# Patient Record
Sex: Female | Born: 1960
Health system: Southern US, Community
[De-identification: ages and names within clinical notes are randomized; demographics above are authoritative.]

---

## 1998-08-30 ENCOUNTER — Other Ambulatory Visit: Admission: RE | Admit: 1998-08-30 | Discharge: 1998-08-30 | Payer: Self-pay | Admitting: Obstetrics and Gynecology

## 1999-09-29 ENCOUNTER — Other Ambulatory Visit: Admission: RE | Admit: 1999-09-29 | Discharge: 1999-09-29 | Payer: Self-pay | Admitting: *Deleted

## 2000-11-20 ENCOUNTER — Ambulatory Visit (HOSPITAL_COMMUNITY): Admission: RE | Admit: 2000-11-20 | Discharge: 2000-11-20 | Payer: Self-pay | Admitting: Obstetrics and Gynecology

## 2000-11-20 ENCOUNTER — Encounter: Payer: Self-pay | Admitting: Obstetrics and Gynecology

## 2001-05-27 ENCOUNTER — Other Ambulatory Visit: Admission: RE | Admit: 2001-05-27 | Discharge: 2001-05-27 | Payer: Self-pay | Admitting: Obstetrics and Gynecology

## 2002-08-19 ENCOUNTER — Other Ambulatory Visit: Admission: RE | Admit: 2002-08-19 | Discharge: 2002-08-19 | Payer: Self-pay | Admitting: Obstetrics and Gynecology

## 2002-11-14 ENCOUNTER — Inpatient Hospital Stay (HOSPITAL_COMMUNITY): Admission: AD | Admit: 2002-11-14 | Discharge: 2002-11-14 | Payer: Self-pay | Admitting: Obstetrics and Gynecology

## 2002-11-24 ENCOUNTER — Observation Stay (HOSPITAL_COMMUNITY): Admission: RE | Admit: 2002-11-24 | Discharge: 2002-11-25 | Payer: Self-pay | Admitting: Obstetrics and Gynecology

## 2003-02-11 ENCOUNTER — Encounter: Payer: Self-pay | Admitting: Obstetrics and Gynecology

## 2003-02-11 ENCOUNTER — Ambulatory Visit (HOSPITAL_COMMUNITY): Admission: RE | Admit: 2003-02-11 | Discharge: 2003-02-11 | Payer: Self-pay | Admitting: Obstetrics and Gynecology

## 2004-08-23 ENCOUNTER — Other Ambulatory Visit: Admission: RE | Admit: 2004-08-23 | Discharge: 2004-08-23 | Payer: Self-pay | Admitting: Obstetrics and Gynecology

## 2004-09-01 ENCOUNTER — Ambulatory Visit (HOSPITAL_COMMUNITY): Admission: RE | Admit: 2004-09-01 | Discharge: 2004-09-01 | Payer: Self-pay | Admitting: Obstetrics and Gynecology

## 2005-12-27 ENCOUNTER — Other Ambulatory Visit: Admission: RE | Admit: 2005-12-27 | Discharge: 2005-12-27 | Payer: Self-pay | Admitting: Obstetrics and Gynecology

## 2006-01-04 ENCOUNTER — Ambulatory Visit (HOSPITAL_COMMUNITY): Admission: RE | Admit: 2006-01-04 | Discharge: 2006-01-04 | Payer: Self-pay | Admitting: Obstetrics and Gynecology

## 2006-01-11 ENCOUNTER — Encounter: Admission: RE | Admit: 2006-01-11 | Discharge: 2006-01-11 | Payer: Self-pay | Admitting: Orthopedic Surgery

## 2017-12-27 DIAGNOSIS — H11002 Unspecified pterygium of left eye: Secondary | ICD-10-CM | POA: Diagnosis not present

## 2018-06-25 DIAGNOSIS — M542 Cervicalgia: Secondary | ICD-10-CM | POA: Diagnosis not present

## 2018-06-25 DIAGNOSIS — M25511 Pain in right shoulder: Secondary | ICD-10-CM | POA: Diagnosis not present

## 2018-08-27 DIAGNOSIS — Z13220 Encounter for screening for lipoid disorders: Secondary | ICD-10-CM | POA: Diagnosis not present

## 2018-08-27 DIAGNOSIS — Z0000 Encounter for general adult medical examination without abnormal findings: Secondary | ICD-10-CM | POA: Diagnosis not present

## 2021-12-14 LAB — EXTERNAL GENERIC LAB PROCEDURE: COLOGUARD: NEGATIVE

## 2021-12-14 LAB — COLOGUARD: COLOGUARD: NEGATIVE

## 2023-01-02 ENCOUNTER — Other Ambulatory Visit: Payer: Self-pay | Admitting: Obstetrics and Gynecology

## 2023-01-02 DIAGNOSIS — Z1231 Encounter for screening mammogram for malignant neoplasm of breast: Secondary | ICD-10-CM

## 2023-04-13 ENCOUNTER — Other Ambulatory Visit: Payer: Self-pay

## 2023-04-13 ENCOUNTER — Emergency Department (HOSPITAL_COMMUNITY): Payer: 59

## 2023-04-13 ENCOUNTER — Inpatient Hospital Stay (HOSPITAL_COMMUNITY)
Admission: EM | Admit: 2023-04-13 | Discharge: 2023-04-19 | DRG: 392 | Disposition: A | Discharge status: Home / Self Care | Payer: 59 | Source: Ambulatory Visit | Attending: Student | Admitting: Student

## 2023-04-13 ENCOUNTER — Encounter (HOSPITAL_COMMUNITY): Payer: Self-pay | Admitting: Emergency Medicine

## 2023-04-13 DIAGNOSIS — R7401 Elevation of levels of liver transaminase levels: Secondary | ICD-10-CM | POA: Diagnosis present

## 2023-04-13 DIAGNOSIS — E559 Vitamin D deficiency, unspecified: Secondary | ICD-10-CM | POA: Diagnosis present

## 2023-04-13 DIAGNOSIS — K5740 Diverticulitis of both small and large intestine with perforation and abscess without bleeding: Secondary | ICD-10-CM | POA: Diagnosis not present

## 2023-04-13 DIAGNOSIS — E876 Hypokalemia: Secondary | ICD-10-CM | POA: Diagnosis not present

## 2023-04-13 DIAGNOSIS — R1030 Lower abdominal pain, unspecified: Principal | ICD-10-CM

## 2023-04-13 DIAGNOSIS — Z79890 Hormone replacement therapy (postmenopausal): Secondary | ICD-10-CM

## 2023-04-13 DIAGNOSIS — Z90710 Acquired absence of both cervix and uterus: Secondary | ICD-10-CM

## 2023-04-13 DIAGNOSIS — D509 Iron deficiency anemia, unspecified: Secondary | ICD-10-CM | POA: Diagnosis present

## 2023-04-13 DIAGNOSIS — E871 Hypo-osmolality and hyponatremia: Secondary | ICD-10-CM | POA: Diagnosis present

## 2023-04-13 DIAGNOSIS — K5792 Diverticulitis of intestine, part unspecified, without perforation or abscess without bleeding: Secondary | ICD-10-CM | POA: Insufficient documentation

## 2023-04-13 DIAGNOSIS — E860 Dehydration: Secondary | ICD-10-CM | POA: Diagnosis present

## 2023-04-13 DIAGNOSIS — K5720 Diverticulitis of large intestine with perforation and abscess without bleeding: Principal | ICD-10-CM | POA: Diagnosis present

## 2023-04-13 DIAGNOSIS — E785 Hyperlipidemia, unspecified: Secondary | ICD-10-CM | POA: Insufficient documentation

## 2023-04-13 DIAGNOSIS — R109 Unspecified abdominal pain: Secondary | ICD-10-CM | POA: Diagnosis present

## 2023-04-13 LAB — LIPASE, BLOOD: Lipase: 33 U/L (ref 11–51)

## 2023-04-13 LAB — CBC
HCT: 35.6 % — ABNORMAL LOW (ref 36.0–46.0)
Hemoglobin: 11.8 g/dL — ABNORMAL LOW (ref 12.0–15.0)
MCH: 25 pg — ABNORMAL LOW (ref 26.0–34.0)
MCHC: 33.1 g/dL (ref 30.0–36.0)
MCV: 75.4 fL — ABNORMAL LOW (ref 80.0–100.0)
Platelets: 334 10*3/uL (ref 150–400)
RBC: 4.72 MIL/uL (ref 3.87–5.11)
RDW: 13.8 % (ref 11.5–15.5)
WBC: 10.2 10*3/uL (ref 4.0–10.5)
nRBC: 0 % (ref 0.0–0.2)

## 2023-04-13 LAB — URINALYSIS, ROUTINE W REFLEX MICROSCOPIC
Bilirubin Urine: NEGATIVE
Glucose, UA: NEGATIVE mg/dL
Ketones, ur: NEGATIVE mg/dL
Leukocytes,Ua: NEGATIVE
Nitrite: NEGATIVE
Protein, ur: NEGATIVE mg/dL
Specific Gravity, Urine: 1.003 — ABNORMAL LOW (ref 1.005–1.030)
pH: 8 (ref 5.0–8.0)

## 2023-04-13 LAB — COMPREHENSIVE METABOLIC PANEL
ALT: 161 U/L — ABNORMAL HIGH (ref 0–44)
AST: 56 U/L — ABNORMAL HIGH (ref 15–41)
Albumin: 3.3 g/dL — ABNORMAL LOW (ref 3.5–5.0)
Alkaline Phosphatase: 333 U/L — ABNORMAL HIGH (ref 38–126)
Anion gap: 9 (ref 5–15)
BUN: 6 mg/dL — ABNORMAL LOW (ref 8–23)
CO2: 25 mmol/L (ref 22–32)
Calcium: 8.7 mg/dL — ABNORMAL LOW (ref 8.9–10.3)
Chloride: 97 mmol/L — ABNORMAL LOW (ref 98–111)
Creatinine, Ser: 0.67 mg/dL (ref 0.44–1.00)
GFR, Estimated: >60 mL/min (ref >60)
Glucose, Bld: 118 mg/dL — ABNORMAL HIGH (ref 70–99)
Potassium: 3.5 mmol/L (ref 3.5–5.1)
Sodium: 131 mmol/L — ABNORMAL LOW (ref 135–145)
Total Bilirubin: 0.6 mg/dL (ref 0.3–1.2)
Total Protein: 7.2 g/dL (ref 6.5–8.1)

## 2023-04-13 LAB — HEPATITIS PANEL, ACUTE
HCV Ab: NONREACTIVE
Hep A IgM: NONREACTIVE
Hep B C IgM: NONREACTIVE
Hepatitis B Surface Ag: NONREACTIVE

## 2023-04-13 LAB — HIV ANTIBODY (ROUTINE TESTING W REFLEX): HIV Screen 4th Generation wRfx: NONREACTIVE

## 2023-04-13 LAB — ACETAMINOPHEN LEVEL: Acetaminophen (Tylenol), Serum: <10 ug/mL — ABNORMAL LOW (ref 10–30)

## 2023-04-13 MED ORDER — SODIUM CHLORIDE 0.9 % IV BOLUS (SEPSIS)
1000.0000 mL | Freq: Once | INTRAVENOUS | Status: AC
  Administered 2023-04-13: 1000 mL via INTRAVENOUS

## 2023-04-13 MED ORDER — ACETAMINOPHEN 325 MG PO TABS
650.0000 mg | ORAL_TABLET | Freq: Four times a day (QID) | ORAL | Status: DC | PRN
  Administered 2023-04-15: 650 mg via ORAL
  Filled 2023-04-13: qty 2

## 2023-04-13 MED ORDER — METRONIDAZOLE 500 MG/100ML IV SOLN
500.0000 mg | Freq: Once | INTRAVENOUS | Status: AC
  Administered 2023-04-13: 500 mg via INTRAVENOUS
  Filled 2023-04-13: qty 100

## 2023-04-13 MED ORDER — IOHEXOL 350 MG/ML SOLN
55.0000 mL | Freq: Once | INTRAVENOUS | Status: AC | PRN
  Administered 2023-04-13: 55 mL via INTRAVENOUS

## 2023-04-13 MED ORDER — ENOXAPARIN SODIUM 40 MG/0.4ML IJ SOSY: 40.0000 mg | PREFILLED_SYRINGE | INTRAMUSCULAR | Status: DC

## 2023-04-13 MED ORDER — HYDROMORPHONE HCL 1 MG/ML IJ SOLN
0.5000 mg | INTRAMUSCULAR | Status: DC | PRN
  Administered 2023-04-13: 0.5 mg via INTRAVENOUS
  Administered 2023-04-14 – 2023-04-15 (×2): 1 mg via INTRAVENOUS
  Filled 2023-04-13 (×3): qty 1

## 2023-04-13 MED ORDER — ONDANSETRON HCL 4 MG PO TABS: 4.0000 mg | ORAL_TABLET | Freq: Four times a day (QID) | ORAL | Status: DC | PRN

## 2023-04-13 MED ORDER — MORPHINE SULFATE (PF) 4 MG/ML IV SOLN
4.0000 mg | Freq: Once | INTRAVENOUS | Status: AC
  Administered 2023-04-13: 4 mg via INTRAVENOUS
  Filled 2023-04-13: qty 1

## 2023-04-13 MED ORDER — SODIUM CHLORIDE 0.9 % IV SOLN: 2.0000 g | INTRAVENOUS | Status: DC

## 2023-04-13 MED ORDER — SODIUM CHLORIDE 0.9 % IV SOLN: INTRAVENOUS | Status: DC

## 2023-04-13 MED ORDER — SODIUM CHLORIDE 0.9 % IV SOLN
1.0000 g | Freq: Once | INTRAVENOUS | Status: AC
  Administered 2023-04-13: 1 g via INTRAVENOUS
  Filled 2023-04-13: qty 10

## 2023-04-13 MED ORDER — ACETAMINOPHEN 650 MG RE SUPP: 650.0000 mg | Freq: Four times a day (QID) | RECTAL | Status: DC | PRN

## 2023-04-13 MED ORDER — RISAQUAD PO CAPS
2.0000 | ORAL_CAPSULE | Freq: Three times a day (TID) | ORAL | Status: DC
  Administered 2023-04-13 – 2023-04-19 (×16): 2 via ORAL
  Filled 2023-04-13 (×18): qty 2

## 2023-04-13 MED ORDER — ONDANSETRON HCL 4 MG/2ML IJ SOLN
4.0000 mg | Freq: Once | INTRAMUSCULAR | Status: AC
  Administered 2023-04-13: 4 mg via INTRAVENOUS
  Filled 2023-04-13: qty 2

## 2023-04-13 MED ORDER — ONDANSETRON HCL 4 MG/2ML IJ SOLN
4.0000 mg | Freq: Four times a day (QID) | INTRAMUSCULAR | Status: DC | PRN
  Administered 2023-04-14 – 2023-04-18 (×6): 4 mg via INTRAVENOUS
  Filled 2023-04-13 (×6): qty 2

## 2023-04-13 MED ORDER — METRONIDAZOLE 500 MG/100ML IV SOLN
500.0000 mg | Freq: Two times a day (BID) | INTRAVENOUS | Status: DC
  Administered 2023-04-14: 500 mg via INTRAVENOUS
  Filled 2023-04-13: qty 100

## 2023-04-13 MED ORDER — OXYCODONE HCL 5 MG PO TABS
5.0000 mg | ORAL_TABLET | ORAL | Status: DC | PRN
  Administered 2023-04-14 – 2023-04-18 (×9): 5 mg via ORAL
  Filled 2023-04-13 (×9): qty 1

## 2023-04-13 NOTE — ED Notes (Signed)
Pt went to the bathroom with PVR of 272cc. Pt then went to urinate for the 2nd time. This time PVR is 25ml. MD notified.

## 2023-04-13 NOTE — ED Notes (Signed)
ED TO INPATIENT HANDOFF REPORT  ED Nurse Name and Phone #: Sheilah Mins [phone])  S Name/Age/Gender Sarah Waters 62 y.o. female Room/Bed: 016C/016C  Code Status   Code Status: Full Code  Home/SNF/Other Home Patient oriented to: self, place, time, and situation Is this baseline? Yes   Triage Complete: Triage complete  Chief Complaint Diverticulitis of both large and small intestine with abscess [K57.40]  Triage Note Pt. Stated, I've been to my Dr. For stomach pain since Monday and he sent me here.  Denies any N/V/D   Allergies Not on File  Level of Care/Admitting Diagnosis ED Disposition     ED Disposition  Admit   Condition  --   Comment  Hospital Area: [facility] [[id number]]  Level of Care: Med-Surg [16]  May admit patient to [facility] or [facility] if equivalent level of care is available:: No  Covid Evaluation: Asymptomatic - no recent exposure (last 10 days) testing not required  Diagnosis: Diverticulitis of both large and small intestine with abscess [[id number]]  Admitting Physician: [facility] [[id number]]  Attending Physician: [facility] [[id number]]  Certification:: I certify this patient will need inpatient services for at least 2 midnights  Estimated Length of Stay: 2          B Medical/Surgery History No past medical history on file.    A IV Location/Drains/Wounds Patient Lines/Drains/Airways Status     Active Line/Drains/Airways     Name Placement date Placement time Site Days   Peripheral IV 04/13/23 20 G Right Antecubital 04/13/23  1409  Antecubital  less than 1            Intake/Output Last 24 hours  Intake/Output Summary (Last 24 hours) at 04/13/2023 1848 Last data filed at 04/13/2023 1847 Gross per 24 hour  Intake 1200 ml  Output --  Net 1200 ml    Labs/Imaging Results for orders placed or performed during the hospital encounter of 04/13/23 (from the past 48 hour(s))  Lipase, blood     Status:  None   Collection Time: 04/13/23 11:38 AM  Result Value Ref Range   Lipase 33 11 - 51 U/L    Comment: Performed at [facility], [address], [place], [place] [address]  Comprehensive metabolic panel     Status: Abnormal   Collection Time: 04/13/23 11:38 AM  Result Value Ref Range   Sodium 131 (L) 135 - 145 mmol/L   Potassium 3.5 3.5 - 5.1 mmol/L   Chloride 97 (L) 98 - 111 mmol/L   CO2 25 22 - 32 mmol/L   Glucose, Bld 118 (H) 70 - 99 mg/dL    Comment: Glucose reference range applies only to samples taken after fasting for at least 8 hours.   BUN 6 (L) 8 - 23 mg/dL   Creatinine, Ser 1.09 0.44 - 1.00 mg/dL   Calcium 8.7 (L) 8.9 - 10.3 mg/dL   Total Protein 7.2 6.5 - 8.1 g/dL   Albumin 3.3 (L) 3.5 - 5.0 g/dL   AST 56 (H) 15 - 41 U/L   ALT 161 (H) 0 - 44 U/L   Alkaline Phosphatase 333 (H) 38 - 126 U/L   Total Bilirubin 0.6 0.3 - 1.2 mg/dL   GFR, Estimated >32 >35 mL/min    Comment: (NOTE) Calculated using the CKD-EPI Creatinine Equation (2021)    Anion gap 9 5 - 15    Comment: Performed at [facility], [address], [place], [place] [address]  CBC     Status: Abnormal   Collection Time: 04/13/23 11:38 AM  Result Value Ref Range   WBC 10.2 4.0 - 10.5 K/uL   RBC 4.72 3.87 - 5.11 MIL/uL   Hemoglobin 11.8 (L) 12.0 - 15.0 g/dL   HCT 13.0 (L) 86.5 - 78.4 %   MCV 75.4 (L) 80.0 - 100.0 fL   MCH 25.0 (L) 26.0 - 34.0 pg   MCHC 33.1 30.0 - 36.0 g/dL   RDW 69.6 29.5 - 28.4 %   Platelets 334 150 - 400 K/uL   nRBC 0.0 0.0 - 0.2 %    Comment: Performed at [facility], [address], [place], [place] [address]  Urinalysis, Routine w reflex microscopic -Urine, Clean Catch     Status: Abnormal   Collection Time: 04/13/23 11:55 AM  Result Value Ref Range   Color, Urine STRAW (A) YELLOW   APPearance CLEAR CLEAR   Specific Gravity, Urine 1.003 (L) 1.005 - 1.030   pH 8.0 5.0 - 8.0   Glucose, UA NEGATIVE NEGATIVE mg/dL   Hgb urine dipstick SMALL (A) NEGATIVE    Bilirubin Urine NEGATIVE NEGATIVE   Ketones, ur NEGATIVE NEGATIVE mg/dL   Protein, ur NEGATIVE NEGATIVE mg/dL   Nitrite NEGATIVE NEGATIVE   Leukocytes,Ua NEGATIVE NEGATIVE   RBC / HPF 0-5 0 - 5 RBC/hpf   WBC, UA 0-5 0 - 5 WBC/hpf   Bacteria, UA RARE (A) NONE SEEN   Squamous Epithelial / HPF 0-5 0 - 5 /HPF    Comment: Performed at [facility], [address], [place], [place] [address]  Acetaminophen level     Status: Abnormal   Collection Time: 04/13/23  1:46 PM  Result Value Ref Range   Acetaminophen (Tylenol), Serum <10 (L) 10 - 30 ug/mL    Comment: (NOTE) Therapeutic concentrations vary significantly. A range of 10-30 ug/mL  may be an effective concentration for many patients. However, some  are best treated at concentrations outside of this range. Acetaminophen concentrations >150 ug/mL at 4 hours after ingestion  and >50 ug/mL at 12 hours after ingestion are often associated with  toxic reactions.  Performed at [facility], [address] [place], [place] [address]    CT ABDOMEN PELVIS W CONTRAST  Result Date: 04/13/2023 CLINICAL DATA:  Left lower quadrant pain.  New transaminitis. EXAM: CT ABDOMEN AND PELVIS WITH CONTRAST TECHNIQUE: Multidetector CT imaging of the abdomen and pelvis was performed using the standard protocol following bolus administration of intravenous contrast. RADIATION DOSE REDUCTION: This exam was performed according to the departmental dose-optimization program which includes automated exposure control, adjustment of the Jonta Gastineau and/or kV according to patient size and/or use of iterative reconstruction technique. CONTRAST:  55mL OMNIPAQUE IOHEXOL 350 MG/ML SOLN COMPARISON:  Report only CT abdomen and pelvis 02/11/2003 FINDINGS: Lower chest: No acute abnormality. Hepatobiliary: No focal liver abnormality is seen. No gallstones, gallbladder wall thickening, or biliary dilatation. Pancreas: Unremarkable. No pancreatic ductal dilatation or surrounding  inflammatory changes. Spleen: Normal in size without focal abnormality. Adrenals/Urinary Tract: Adrenal glands are unremarkable. Kidneys are normal, without renal calculi, focal lesion, or hydronephrosis. Bladder is unremarkable. Stomach/Bowel: There is diffuse colonic diverticulosis. There is wall thickening and inflammation of the distal descending colon most compatible with diverticulitis. There are small bubbles of extraluminal gas with surrounding ill-defined fluid adjacent to this portion of the colon measuring 3.5 x 2.4 x 2.7 cm image 3/57. There is no bowel obstruction. The appendix is not  seen. Small bowel loops are within normal limits. Vascular/Lymphatic: No significant vascular findings are present. No enlarged abdominal or pelvic lymph nodes. Reproductive: Status post hysterectomy. No adnexal masses. Other: No abdominal wall hernia or abnormality. No abdominopelvic ascites. Musculoskeletal: No acute or significant osseous findings. IMPRESSION: Acute diverticulitis of the distal descending colon. There is a small amount of extraluminal gas and ill-defined fluid adjacent to this portion of the colon worrisome for perforation and developing abscess. Electronically Signed   By: Darliss Cheney M.D.   On: 04/13/2023 15:29    Pending Labs Unresulted Labs (From admission, onward)     Start     Ordered   04/14/23 0500  Hepatic function panel  Tomorrow morning,   R        04/13/23 1834   04/14/23 0500  CBC  Tomorrow morning,   R        04/13/23 1842   04/13/23 1828  HIV Antibody (routine testing w rflx)  (HIV Antibody (Routine testing w reflex) panel)  Once,   R        04/13/23 1829   04/13/23 1631  Blood culture (routine x 2)  BLOOD CULTURE X 2,   R (with STAT occurrences)      04/13/23 1630   04/13/23 1430  Hepatitis panel, acute  Once,   R        04/13/23 1430            Vitals/Pain Today's Vitals   04/13/23 1615 04/13/23 1715 04/13/23 1755 04/13/23 1800  BP: 136/87 (!) 141/84  (!)  144/79  Pulse: 78 79  80  Resp: 12 17  15   Temp:   98.8 F (37.1 C)   TempSrc:   Oral   SpO2: 100% 100%  100%  Weight:      Height:      PainSc:        Isolation Precautions No active isolations  Medications Medications  cefTRIAXone (ROCEPHIN) 2 g in sodium chloride 0.9 % 100 mL IVPB (has no administration in time range)  metroNIDAZOLE (FLAGYL) IVPB 500 mg (has no administration in time range)  acidophilus (RISAQUAD) capsule 2 capsule (has no administration in time range)  enoxaparin (LOVENOX) injection 40 mg (has no administration in time range)  0.9 %  sodium chloride infusion (has no administration in time range)  acetaminophen (TYLENOL) tablet 650 mg (has no administration in time range)    Or  acetaminophen (TYLENOL) suppository 650 mg (has no administration in time range)  oxyCODONE (Oxy IR/ROXICODONE) immediate release tablet 5 mg (has no administration in time range)  HYDROmorphone (DILAUDID) injection 0.5-1 mg (has no administration in time range)  ondansetron (ZOFRAN) tablet 4 mg (has no administration in time range)    Or  ondansetron (ZOFRAN) injection 4 mg (has no administration in time range)  sodium chloride 0.9 % bolus 1,000 mL (0 mLs Intravenous Stopped 04/13/23 1536)  morphine (PF) 4 MG/ML injection 4 mg (4 mg Intravenous Given 04/13/23 1410)  ondansetron (ZOFRAN) injection 4 mg (4 mg Intravenous Given 04/13/23 1409)  iohexol (OMNIPAQUE) 350 MG/ML injection 55 mL (55 mLs Intravenous Contrast Given 04/13/23 1451)  metroNIDAZOLE (FLAGYL) IVPB 500 mg (0 mg Intravenous Stopped 04/13/23 1847)  cefTRIAXone (ROCEPHIN) 1 g in sodium chloride 0.9 % 100 mL IVPB (0 g Intravenous Stopped 04/13/23 1755)    Mobility walks     Focused Assessments GI   R Recommendations: See Admitting Provider Note  Report given to:   Additional Notes:

## 2023-04-13 NOTE — Progress Notes (Signed)
Admitted to 6n9 from ED at this time.

## 2023-04-13 NOTE — ED Provider Notes (Signed)
[place] EMERGENCY DEPARTMENT AT [facility] Provider Note   CSN: [id number] Arrival date & time: 04/13/23  1112     History  Chief Complaint  Patient presents with   Abdominal Pain    Sarah Waters is a 62 y.o. female.  With PMH of hyperlipidemia, vitamin D deficiency who presents with ongoing abdominal pain over the past 5 days.  She is complaining of lower abdominal pain worse in the left lower quadrant that has been constant in nature throbbing and ongoing for 5 days.  She has had decreased p.o. intake as result but no fevers, no chills, no vomiting or diarrhea.  Has never had a colonoscopy before.  Has no urinary symptoms.  Notes having previous hysterectomy.  She takes 1 dose of Tylenol and Aleve a day for pain but is not helping much.  She denies any alcohol or drug use.  No recent travel.  No recent tick borne exposure.  Notes having about 2 weeks ago a flulike syndrome with generalized aches, fatigue, congestion and rhinorrhea but that has since resolved.   Abdominal Pain      Home Medications Prior to Admission medications   Not on File      Allergies    Patient has no allergy information on record.    Review of Systems   Review of Systems  Gastrointestinal:  Positive for abdominal pain.    Physical Exam Updated Vital Signs BP (!) 141/84   Pulse 79   Temp (!) 97.3 F (36.3 C)   Resp 17   Ht 5' (1.524 m)   Wt 53.5 kg   SpO2 100%   BMI 23.05 kg/m  Physical Exam Constitutional: Alert and oriented. NAD. Eyes: Conjunctivae are normal. ENT      Head: Normocephalic and atraumatic Cardiovascular: S1, S2,  Normal and symmetric distal pulses are present in all extremities.Warm and well perfused. Respiratory: Normal respiratory effort. Breath sounds are normal. Gastrointestinal: Soft and LLQ ttp, voluntary guarding Musculoskeletal: Normal range of motion in all extremities. Neurologic: Normal speech and language. No gross focal neurologic  deficits are appreciated. Skin: Skin is warm, dry and intact. No rash noted. Psychiatric: Mood and affect are normal. Speech and behavior are normal.   ED Results / Procedures / Treatments   Labs (all labs ordered are listed, but only abnormal results are displayed) Labs Reviewed  COMPREHENSIVE METABOLIC PANEL - Abnormal; Notable for the following components:      Result Value   Sodium 131 (*)    Chloride 97 (*)    Glucose, Bld 118 (*)    BUN 6 (*)    Calcium 8.7 (*)    Albumin 3.3 (*)    AST 56 (*)    ALT 161 (*)    Alkaline Phosphatase 333 (*)    All other components within normal limits  CBC - Abnormal; Notable for the following components:   Hemoglobin 11.8 (*)    HCT 35.6 (*)    MCV 75.4 (*)    MCH 25.0 (*)    All other components within normal limits  URINALYSIS, ROUTINE W REFLEX MICROSCOPIC - Abnormal; Notable for the following components:   Color, Urine STRAW (*)    Specific Gravity, Urine 1.003 (*)    Hgb urine dipstick SMALL (*)    Bacteria, UA RARE (*)    All other components within normal limits  ACETAMINOPHEN LEVEL - Abnormal; Notable for the following components:   Acetaminophen (Tylenol), Serum <10 (*)  All other components within normal limits  CULTURE, BLOOD (ROUTINE X 2)  CULTURE, BLOOD (ROUTINE X 2)  LIPASE, BLOOD  HEPATITIS PANEL, ACUTE    EKG None  Radiology CT ABDOMEN PELVIS W CONTRAST  Result Date: 04/13/2023 CLINICAL DATA:  Left lower quadrant pain.  New transaminitis. EXAM: CT ABDOMEN AND PELVIS WITH CONTRAST TECHNIQUE: Multidetector CT imaging of the abdomen and pelvis was performed using the standard protocol following bolus administration of intravenous contrast. RADIATION DOSE REDUCTION: This exam was performed according to the departmental dose-optimization program which includes automated exposure control, adjustment of the mA and/or kV according to patient size and/or use of iterative reconstruction technique. CONTRAST:  55mL OMNIPAQUE  IOHEXOL 350 MG/ML SOLN COMPARISON:  Report only CT abdomen and pelvis 02/11/2003 FINDINGS: Lower chest: No acute abnormality. Hepatobiliary: No focal liver abnormality is seen. No gallstones, gallbladder wall thickening, or biliary dilatation. Pancreas: Unremarkable. No pancreatic ductal dilatation or surrounding inflammatory changes. Spleen: Normal in size without focal abnormality. Adrenals/Urinary Tract: Adrenal glands are unremarkable. Kidneys are normal, without renal calculi, focal lesion, or hydronephrosis. Bladder is unremarkable. Stomach/Bowel: There is diffuse colonic diverticulosis. There is wall thickening and inflammation of the distal descending colon most compatible with diverticulitis. There are small bubbles of extraluminal gas with surrounding ill-defined fluid adjacent to this portion of the colon measuring 3.5 x 2.4 x 2.7 cm image 3/57. There is no bowel obstruction. The appendix is not seen. Small bowel loops are within normal limits. Vascular/Lymphatic: No significant vascular findings are present. No enlarged abdominal or pelvic lymph nodes. Reproductive: Status post hysterectomy. No adnexal masses. Other: No abdominal wall hernia or abnormality. No abdominopelvic ascites. Musculoskeletal: No acute or significant osseous findings. IMPRESSION: Acute diverticulitis of the distal descending colon. There is a small amount of extraluminal gas and ill-defined fluid adjacent to this portion of the colon worrisome for perforation and developing abscess. Electronically Signed   By: Darliss Cheney M.D.   On: 04/13/2023 15:29    Procedures Procedures    Medications Ordered in ED Medications  metroNIDAZOLE (FLAGYL) IVPB 500 mg (500 mg Intravenous New Bag/Given 04/13/23 1714)  cefTRIAXone (ROCEPHIN) 1 g in sodium chloride 0.9 % 100 mL IVPB (1 g Intravenous New Bag/Given 04/13/23 1711)  sodium chloride 0.9 % bolus 1,000 mL (0 mLs Intravenous Stopped 04/13/23 1536)  morphine (PF) 4 MG/ML injection 4  mg (4 mg Intravenous Given 04/13/23 1410)  ondansetron (ZOFRAN) injection 4 mg (4 mg Intravenous Given 04/13/23 1409)  iohexol (OMNIPAQUE) 350 MG/ML injection 55 mL (55 mLs Intravenous Contrast Given 04/13/23 1451)    ED Course/ Medical Decision Making/ A&P Clinical Course as of 04/13/23 1719  Fri Apr 13, 2023  1421 Total Bilirubin: 0.6 [VB]  1525 Stable 61 YOF with abdominal pain x5 days. CT pending.  No acute distress. If nonacute then stable for OP referral and care. [CC]    Clinical Course User Index [CC] Glyn Ade, MD [VB] Mardene Sayer, MD                             Medical Decision Making Krisa Longhi is a 62 y.o. female.  With PMH of hyperlipidemia, vitamin D deficiency who presents with ongoing abdominal pain over the past 5 days.    Based on patient's left lower quadrant tenderness on exam, consider colitis, diverticulitis, mass lesion, UTI among multiple other etiologies.  Less suspected UTI or nephrolithiasis with no CVA tenderness or urinary symptoms.  Labs reviewed by me normal white blood cell count 10.2.  Mild microcytic anemia hemoglobin 11.8.  Creatinine 0.67 within normal limits however decreased sodium 131 decreased chloride 97 likely hypovolemic.  Started on fluids.  Also new mild transaminitis AST 56 ALT 61 alk phos 333.  However normal total bilirubin.  Normal lipase.  She has no upper quadrant pain.  Not concern for cholelithiasis or cholecystitis.  Sent for hepatitis panel and acetaminophen levels although no severe acetaminophen use.  No alcohol use.  Signed out to Dr. Doran Durand pending CTAP with contrast results.  Disposition pending CTAP with results.  Amount and/or Complexity of Data Reviewed Labs: ordered. Decision-making details documented in ED Course. Radiology: ordered.  Risk Prescription drug management.      Final Clinical Impression(s) / ED Diagnoses Final diagnoses:  Lower abdominal pain  Transaminitis    Rx / DC  Orders ED Discharge Orders     None         Mardene Sayer, MD 04/13/23 1719

## 2023-04-13 NOTE — H&P (Addendum)
History and Physical    Sarah Waters ZOX:[id number] DOB: [birth year] DOA: 04/13/2023  PCP: Soundra Pilon, FNP (Confirm with patient/family/NH records and if not entered, this has to be entered at [facility] point of entry) Patient coming from: Home  I have personally briefly reviewed patient's old medical records in [facility]  Chief Complaint: Belly hurts  HPI: Sarah Waters is a 62 y.o. female with medical history significant of HLD, chronic hormone replacement therapy, presented with worsening of abdominal pain.  Symptoms started suddenly on Monday, cramping-like LLQ pain along with "gassy feeling" subjective fever and chills.  Patient thought she developed indigestion and has been taking over-the-counter Pepto-Bismol, Tums, GasX with partial relief.  No diarrhea, last bowel movement was on Monday. Today, the pain has been constant, cramping-like nonradiating which prompted the patient to come to ED.  She has no history of diverticulitis no previous colonoscopy done.  ED Course: Temperature 99.0, blood pressure 150/88.  CT abdomen pelvis showed acute diverticulitis of distal descending colon there is a small amount of extraluminal gas with ill-defined fluid adjacent to this portion of the colon worrisome for perforation and developing abscess.  WBC 10.2, creatinine 0.6 AST 56 ALT 161  Patient was given IV ceftriaxone and Flagyl and multiple rounds of morphine.  Review of Systems: As per HPI otherwise 14 point review of systems negative.    No past medical history on file.  The histories are not reviewed yet. Please review them in the "History" navigator section and refresh this SmartLink.   reports that she has never smoked. She does not have any smokeless tobacco history on file. She reports that she does not currently use alcohol. She reports that she does not currently use drugs.  Not on File  No family history on file.   Prior to Admission medications   Not on  File    Physical Exam: Vitals:   04/13/23 1615 04/13/23 1715 04/13/23 1755 04/13/23 1800  BP: 136/87 (!) 141/84  (!) 144/79  Pulse: 78 79  80  Resp: 12 17  15   Temp:   98.8 F (37.1 C)   TempSrc:   Oral   SpO2: 100% 100%  100%  Weight:      Height:        Constitutional: NAD, calm, comfortable Vitals:   04/13/23 1615 04/13/23 1715 04/13/23 1755 04/13/23 1800  BP: 136/87 (!) 141/84  (!) 144/79  Pulse: 78 79  80  Resp: 12 17  15   Temp:   98.8 F (37.1 C)   TempSrc:   Oral   SpO2: 100% 100%  100%  Weight:      Height:       Eyes: PERRL, lids and conjunctivae normal ENMT: Mucous membranes are moist. Posterior pharynx clear of any exudate or lesions.Normal dentition.  Neck: normal, supple, no masses, no thyromegaly Respiratory: clear to auscultation bilaterally, no wheezing, no crackles. Normal respiratory effort. No accessory muscle use.  Cardiovascular: Regular rate and rhythm, no murmurs / rubs / gallops. No extremity edema. 2+ pedal pulses. No carotid bruits.  Abdomen: Severe tenderness  on LLQ, with some guarding and rebound pain, no masses palpated. No hepatosplenomegaly. Bowel sounds positive.  Musculoskeletal: no clubbing / cyanosis. No joint deformity upper and lower extremities. Good ROM, no contractures. Normal muscle tone.  Skin: no rashes, lesions, ulcers. No induration Neurologic: CN 2-12 grossly intact. Sensation intact, DTR normal. Strength 5/5 in all 4.  Psychiatric: Normal judgment and insight. Alert and oriented x  3. Normal mood.     Labs on Admission: I have personally reviewed following labs and imaging studies  CBC: Recent Labs  Lab 04/13/23 1138  WBC 10.2  HGB 11.8*  HCT 35.6*  MCV 75.4*  PLT 334   Basic Metabolic Panel: Recent Labs  Lab 04/13/23 1138  NA 131*  K 3.5  CL 97*  CO2 25  GLUCOSE 118*  BUN 6*  CREATININE 0.67  CALCIUM 8.7*   GFR: Estimated Creatinine Clearance: 53 mL/min (by C-G formula based on SCr of 0.67  mg/dL). Liver Function Tests: Recent Labs  Lab 04/13/23 1138  AST 56*  ALT 161*  ALKPHOS 333*  BILITOT 0.6  PROT 7.2  ALBUMIN 3.3*   Recent Labs  Lab 04/13/23 1138  LIPASE 33   No results for input(s): "AMMONIA" in the last 168 hours. Coagulation Profile: No results for input(s): "INR", "PROTIME" in the last 168 hours. Cardiac Enzymes: No results for input(s): "CKTOTAL", "CKMB", "CKMBINDEX", "TROPONINI" in the last 168 hours. BNP (last 3 results) No results for input(s): "PROBNP" in the last 8760 hours. HbA1C: No results for input(s): "HGBA1C" in the last 72 hours. CBG: No results for input(s): "GLUCAP" in the last 168 hours. Lipid Profile: No results for input(s): "CHOL", "HDL", "LDLCALC", "TRIG", "CHOLHDL", "LDLDIRECT" in the last 72 hours. Thyroid Function Tests: No results for input(s): "TSH", "T4TOTAL", "FREET4", "T3FREE", "THYROIDAB" in the last 72 hours. Anemia Panel: No results for input(s): "VITAMINB12", "FOLATE", "FERRITIN", "TIBC", "IRON", "RETICCTPCT" in the last 72 hours. Urine analysis:    Component Value Date/Time   COLORURINE STRAW (A) 04/13/2023 1155   APPEARANCEUR CLEAR 04/13/2023 1155   LABSPEC 1.003 (L) 04/13/2023 1155   PHURINE 8.0 04/13/2023 1155   GLUCOSEU NEGATIVE 04/13/2023 1155   HGBUR SMALL (A) 04/13/2023 1155   BILIRUBINUR NEGATIVE 04/13/2023 1155   KETONESUR NEGATIVE 04/13/2023 1155   PROTEINUR NEGATIVE 04/13/2023 1155   NITRITE NEGATIVE 04/13/2023 1155   LEUKOCYTESUR NEGATIVE 04/13/2023 1155    Radiological Exams on Admission: CT ABDOMEN PELVIS W CONTRAST  Result Date: 04/13/2023 CLINICAL DATA:  Left lower quadrant pain.  New transaminitis. EXAM: CT ABDOMEN AND PELVIS WITH CONTRAST TECHNIQUE: Multidetector CT imaging of the abdomen and pelvis was performed using the standard protocol following bolus administration of intravenous contrast. RADIATION DOSE REDUCTION: This exam was performed according to the departmental dose-optimization  program which includes automated exposure control, adjustment of the mA and/or kV according to patient size and/or use of iterative reconstruction technique. CONTRAST:  55mL OMNIPAQUE IOHEXOL 350 MG/ML SOLN COMPARISON:  Report only CT abdomen and pelvis 02/11/2003 FINDINGS: Lower chest: No acute abnormality. Hepatobiliary: No focal liver abnormality is seen. No gallstones, gallbladder wall thickening, or biliary dilatation. Pancreas: Unremarkable. No pancreatic ductal dilatation or surrounding inflammatory changes. Spleen: Normal in size without focal abnormality. Adrenals/Urinary Tract: Adrenal glands are unremarkable. Kidneys are normal, without renal calculi, focal lesion, or hydronephrosis. Bladder is unremarkable. Stomach/Bowel: There is diffuse colonic diverticulosis. There is wall thickening and inflammation of the distal descending colon most compatible with diverticulitis. There are small bubbles of extraluminal gas with surrounding ill-defined fluid adjacent to this portion of the colon measuring 3.5 x 2.4 x 2.7 cm image 3/57. There is no bowel obstruction. The appendix is not seen. Small bowel loops are within normal limits. Vascular/Lymphatic: No significant vascular findings are present. No enlarged abdominal or pelvic lymph nodes. Reproductive: Status post hysterectomy. No adnexal masses. Other: No abdominal wall hernia or abnormality. No abdominopelvic ascites. Musculoskeletal: No acute or  significant osseous findings. IMPRESSION: Acute diverticulitis of the distal descending colon. There is a small amount of extraluminal gas and ill-defined fluid adjacent to this portion of the colon worrisome for perforation and developing abscess. Electronically Signed   By: Darliss Cheney M.D.   On: 04/13/2023 15:29    EKG: None  Assessment/Plan Principal Problem:   Diverticulitis of both large and small intestine with abscess Active Problems:   Diverticulitis   HLD (hyperlipidemia)  (please populate well  all problems here in Problem List. (For example, if patient is on BP meds at home and you resume or decide to hold them, it is a problem that needs to be her. Same for CAD, COPD, HLD and so on)  Acute perforated diverticulitis -With possibility of micro abscess formation.  General surgeon review CT abdomen pelvis and initial impression is no emergency surgery indicated.  Patient does have symptoms signs of acute peritonitis but no otherwise SIRS or sepsis.  Agreed with conservative management with n.p.o., IV fluid and IV antibiotics for now. Will D/W neurosurgeon regarding possibility to get IR involved. -Continue ceftriaxone and Flagyl -N.p.o., IV fluid -Alternate oxycodone and Dilaudid -Discussed with patient regarding outpatient GI follow-up and outpatient colonoscopy in 6 weeks.  Patient and her husband agreed with the plan. -Hold off chemical DVT prophylasix  Acute peritonitis -Appears to be contained mainly in LLQ with no SIRS or sepsis, management as above  Transaminitis -Unknown duration -CT abdominal pelvis did not show significant intra/extra hepatic etiology -ED ordered acute hepatitis panel -Repeat LFTs in AM -Hold off statin  Hyponatremia -Unknown duration -On IVF, repeat Na level in AM.  HLD -Hold off Simvastatin  DVT prophylaxis: SCD Code Status: Full code Family Communication: Husband at bedside Disposition Plan: Patient is sick with perforated type colitis and peritonitis, requiring inpatient IV antibiotics and inpatient general surgeon consultation, expect more than 2 midnight hospital stay Consults called: general surgery Admission status: MedSurg admission   Emeline General MD [facility] Pager [phone]  04/13/2023, 6:30 PM

## 2023-04-13 NOTE — ED Provider Notes (Signed)
Care of patient received from prior provider at 4:30 PM, please see their note for complete H/P and care plan.  Received handoff per ED course.  Clinical Course as of 04/13/23 1630  Fri Apr 13, 2023  1421 Total Bilirubin: 0.6 [VB]  1525 Stable 61 YOF with abdominal pain x5 days. CT pending.  No acute distress. If nonacute then stable for OP referral and care. [CC]    Clinical Course User Index [CC] Glyn Ade, MD [VB] Mardene Sayer, MD    Reassessment: Sarah Waters at bedside.  She states that her abdominal pain is persistent.  Exquisitely tender on physical exam.  CT reviewed demonstrates possible perforation.  Concern for developing peritonitis given worsening pain despite pain medication administration. Patient is not on any blood thinners has a minimal medical history has not eaten since yesterday. Will broaden treatment to include Flagyl Rocephin with blood cultures pending.  Will consult general surgery for further recommendations anticipate inpatient care and management.  General surgery team agreed with initiation of Rocephin and Flagyl, n.p.o. and conservative management at this time due to findings on CT.  Recommended medical admission with plan to reassess for interval changes.  CRITICAL CARE Performed by: Glyn Ade   Total critical care time: 45 minutes for perforated hollow viscus requiring immediate treatment, consultation with multiple providers.  Critical care time was exclusive of separately billable procedures and treating other patients.  Critical care was necessary to treat or prevent imminent or life-threatening deterioration.  Critical care was time spent personally by me on the following activities: development of treatment plan with patient and/or surrogate as well as nursing, discussions with consultants, evaluation of patient's response to treatment, examination of patient, obtaining history from patient or surrogate, ordering and  performing treatments and interventions, ordering and review of laboratory studies, ordering and review of radiographic studies, pulse oximetry and re-evaluation of patient's condition.     Glyn Ade, MD 04/13/23 2245

## 2023-04-13 NOTE — ED Triage Notes (Signed)
Pt. Stated, I've been to my Dr. For stomach pain since Monday and he sent me here.  Denies any N/V/D

## 2023-04-13 NOTE — Plan of Care (Signed)
  Problem: Health Behavior/Discharge Planning: Goal: Ability to manage health-related needs will improve Outcome: Progressing   Problem: Clinical Measurements: Goal: Ability to maintain clinical measurements within normal limits will improve Outcome: Progressing Goal: Cardiovascular complication will be avoided Outcome: Progressing   Problem: Activity: Goal: Risk for activity intolerance will decrease Outcome: Progressing   

## 2023-04-14 DIAGNOSIS — K5740 Diverticulitis of both small and large intestine with perforation and abscess without bleeding: Secondary | ICD-10-CM | POA: Diagnosis not present

## 2023-04-14 LAB — MAGNESIUM: Magnesium: 2.1 mg/dL (ref 1.7–2.4)

## 2023-04-14 LAB — CULTURE, BLOOD (ROUTINE X 2): Culture: NO GROWTH

## 2023-04-14 LAB — BASIC METABOLIC PANEL
Anion gap: 10 (ref 5–15)
BUN: 6 mg/dL — ABNORMAL LOW (ref 8–23)
CO2: 22 mmol/L (ref 22–32)
Calcium: 8.2 mg/dL — ABNORMAL LOW (ref 8.9–10.3)
Chloride: 106 mmol/L (ref 98–111)
Creatinine, Ser: 0.64 mg/dL (ref 0.44–1.00)
GFR, Estimated: >60 mL/min (ref >60)
Glucose, Bld: 113 mg/dL — ABNORMAL HIGH (ref 70–99)
Potassium: 3.1 mmol/L — ABNORMAL LOW (ref 3.5–5.1)
Sodium: 138 mmol/L (ref 135–145)

## 2023-04-14 LAB — HEPATIC FUNCTION PANEL
ALT: 126 U/L — ABNORMAL HIGH (ref 0–44)
AST: 34 U/L (ref 15–41)
Albumin: 3.1 g/dL — ABNORMAL LOW (ref 3.5–5.0)
Alkaline Phosphatase: 287 U/L — ABNORMAL HIGH (ref 38–126)
Bilirubin, Direct: 0.1 mg/dL (ref 0.0–0.2)
Indirect Bilirubin: 0.6 mg/dL (ref 0.3–0.9)
Total Bilirubin: 0.7 mg/dL (ref 0.3–1.2)
Total Protein: 7.1 g/dL (ref 6.5–8.1)

## 2023-04-14 LAB — CBC
HCT: 34 % — ABNORMAL LOW (ref 36.0–46.0)
Hemoglobin: 11 g/dL — ABNORMAL LOW (ref 12.0–15.0)
MCH: 24.3 pg — ABNORMAL LOW (ref 26.0–34.0)
MCHC: 32.4 g/dL (ref 30.0–36.0)
MCV: 75.2 fL — ABNORMAL LOW (ref 80.0–100.0)
Platelets: 339 10*3/uL (ref 150–400)
RBC: 4.52 MIL/uL (ref 3.87–5.11)
RDW: 14 % (ref 11.5–15.5)
WBC: 12.6 10*3/uL — ABNORMAL HIGH (ref 4.0–10.5)
nRBC: 0 % (ref 0.0–0.2)

## 2023-04-14 MED ORDER — PIPERACILLIN-TAZOBACTAM 3.375 G IVPB
3.3750 g | Freq: Three times a day (TID) | INTRAVENOUS | Status: DC
  Administered 2023-04-14 – 2023-04-19 (×16): 3.375 g via INTRAVENOUS
  Filled 2023-04-14 (×16): qty 50

## 2023-04-14 MED ORDER — POLYETHYL GLYC-PROPYL GLYC PF 0.4-0.3 % OP SOLN: 1.0000 [drp] | Freq: Two times a day (BID) | OPHTHALMIC | Status: DC

## 2023-04-14 MED ORDER — SODIUM CHLORIDE 0.9 % IV SOLN: INTRAVENOUS | Status: DC

## 2023-04-14 MED ORDER — BRIMONIDINE TARTRATE 0.025 % OP SOLN: 1.0000 [drp] | Freq: Every day | OPHTHALMIC | Status: DC

## 2023-04-14 MED ORDER — POTASSIUM CHLORIDE 10 MEQ/100ML IV SOLN
10.0000 meq | INTRAVENOUS | Status: AC
  Administered 2023-04-14 (×6): 10 meq via INTRAVENOUS
  Filled 2023-04-14 (×6): qty 100

## 2023-04-14 MED ORDER — POLYETHYL GLYC-PROPYL GLYC PF 0.4-0.3 % OP SOLN
1.0000 [drp] | Freq: Two times a day (BID) | OPHTHALMIC | Status: DC
  Administered 2023-04-14 – 2023-04-19 (×11): 1 [drp] via OPHTHALMIC
  Filled 2023-04-14 (×2): qty 10

## 2023-04-14 MED ORDER — ESTRADIOL 0.5 MG PO TABS
0.5000 mg | ORAL_TABLET | Freq: Every day | ORAL | Status: DC
  Administered 2023-04-14 – 2023-04-19 (×6): 0.5 mg via ORAL
  Filled 2023-04-14 (×6): qty 1

## 2023-04-14 MED ORDER — BRIMONIDINE TARTRATE 0.025 % OP SOLN
1.0000 [drp] | Freq: Every day | OPHTHALMIC | Status: DC
  Administered 2023-04-14 – 2023-04-19 (×5): 1 [drp] via OPHTHALMIC
  Filled 2023-04-14 (×2): qty 2.5

## 2023-04-14 NOTE — Progress Notes (Signed)
PROGRESS NOTE   Sarah Waters  ZOX:[id number]    DOB: [birth year]    DOA: 04/13/2023  PCP: Soundra Pilon, FNP   I have briefly reviewed patients previous medical records in [facility].  Chief Complaint  Patient presents with   Abdominal Pain    Brief Narrative:  62 year old married female with PMH of HLD, chronic hormone replacement therapy, presented to the ED on 04/13/2023 with complaints of 5 days history of pain across lower abdomen, worse in LLQ with subjective fevers, chills, symptoms unresolved after OTC meds for presumed indigestion, and last BM 2 days PTA.  Never had a colonoscopy but had Cologuard which was negative.  Admitted for distal descending colon acute diverticulitis with perforation and developing abscess.  General surgery consulted, managing conservatively for now.   Assessment & Plan:  Principal Problem:   Diverticulitis of both large and small intestine with abscess Active Problems:   Diverticulitis   HLD (hyperlipidemia)   Distal descending colon acute diverticulitis with perforation and developing abscess: CT A/P 5/31: Confirms above. Never had colonoscopy.  Had negative Cologuard 2023 per patient's report General surgery consultation appreciated, for now managing conservatively: N.p.o. except sips of clears, IV Zosyn, IVF, multimodality pain control. If worsens then may need repeat CT and percutaneous drain by IR.  Hopefully will not need surgery. Pending stabilization and discharge, will need follow-up with GI in 6 to 8 weeks for colonoscopy.  This was discussed in detail with patient and spouse at bedside and they verbalized understanding. Blood cultures x 2: Negative to date.  Hypokalemia: Replace and follow.  Magnesium normal.  Microcytic anemia: Stable.  Outpatient follow-up with PCP.  Transaminitis: Unclear etiology. Acute hepatitis panel and HIV screen negative.  Acetaminophen level negative. No acute hepatobiliary findings on CT  A/P 5/31. Had normal AST and ALT in October 2021. ?  Related to acute infectious process above.  Slowly improving. Trend CMP.  Holding statins.  Hyperlipidemia Holding statins due to abnormal LFTs.  Dehydration with hyponatremia Hyponatremia resolved.  Body mass index is 23.16 kg/m.    ACP Documents: None present. DVT prophylaxis: Place and maintain sequential compression device Start: 04/13/23 1852     Code Status: Full Code:  Family Communication: Spouse at bedside. Disposition:  Status is: Inpatient Remains inpatient appropriate because: Bowel rest, IV fluids and clinical monitoring for acute diverticulitis.     Consultants:   General surgery.  Procedures:     Antimicrobials:   IV Zosyn   Subjective:  Seen this morning.  Spouse at bedside.  Reports her abdominal pain is better, across lower abdomen, more so in left lower quadrant, down to 6/10 in severity compared to 10/10 PTA.  No nausea or vomiting.  Last BM 2 days PTA.  Objective:   Vitals:   04/13/23 2015 04/14/23 0008 04/14/23 0459 04/14/23 0905  BP: (!) 144/78 135/74 (!) 111/58 114/68  Pulse: 83 85 86 82  Resp: 16 17 17 17   Temp: 99 F (37.2 C) 98 F (36.7 C) 98.1 F (36.7 C) 98.1 F (36.7 C)  TempSrc: Oral Oral Oral Oral  SpO2: 100% 94% 97% 98%  Weight: 53.8 kg     Height: 5' (1.524 m)       General exam: Young female, moderately built and nourished lying comfortably supine in bed without distress.  Oral mucosa with borderline hydration. Respiratory system: Clear to auscultation. Respiratory effort normal. Cardiovascular system: S1 & S2 heard, RRR. No JVD, murmurs, rubs, gallops or clicks. No pedal  edema. Gastrointestinal system: Abdomen is nondistended, soft.  Tender in the suprapubic and most in the left lower quadrant on deep palpation.  No guarding, rigidity or rebound. No organomegaly or masses felt. Normal bowel sounds heard. Central nervous system: Alert and oriented. No focal neurological  deficits. Extremities: Symmetric 5 x 5 power. Skin: No rashes, lesions or ulcers Psychiatry: Judgement and insight appear normal. Mood & affect appropriate.     Data Reviewed:   I have personally reviewed following labs and imaging studies   CBC: Recent Labs  Lab 04/13/23 1138 04/14/23 0028  WBC 10.2 12.6*  HGB 11.8* 11.0*  HCT 35.6* 34.0*  MCV 75.4* 75.2*  PLT 334 339    Basic Metabolic Panel: Recent Labs  Lab 04/13/23 1138 04/14/23 0028  NA 131* 138  K 3.5 3.1*  CL 97* 106  CO2 25 22  GLUCOSE 118* 113*  BUN 6* 6*  CREATININE 0.67 0.64  CALCIUM 8.7* 8.2*  MG  --  2.1    Liver Function Tests: Recent Labs  Lab 04/13/23 1138 04/14/23 0028  AST 56* 34  ALT 161* 126*  ALKPHOS 333* 287*  BILITOT 0.6 0.7  PROT 7.2 7.1  ALBUMIN 3.3* 3.1*    CBG: No results for input(s): "GLUCAP" in the last 168 hours.  Microbiology Studies:   Recent Results (from the past 240 hour(s))  Blood culture (routine x 2)     Status: None (Preliminary result)   Collection Time: 04/13/23  4:31 PM   Specimen: BLOOD  Result Value Ref Range Status   Specimen Description BLOOD RIGHT ANTECUBITAL  Final   Special Requests   Final    BOTTLES DRAWN AEROBIC AND ANAEROBIC Blood Culture adequate volume   Culture   Final    NO GROWTH < 24 HOURS Performed at [facility], [address] [place], [place] [address]    Report Status PENDING  Incomplete  Blood culture (routine x 2)     Status: None (Preliminary result)   Collection Time: 04/13/23  4:50 PM   Specimen: BLOOD RIGHT HAND  Result Value Ref Range Status   Specimen Description BLOOD RIGHT HAND  Final   Special Requests   Final    BOTTLES DRAWN AEROBIC AND ANAEROBIC Blood Culture adequate volume   Culture   Final    NO GROWTH < 24 HOURS Performed at [facility], [address] [place], [place] [address]    Report Status PENDING  Incomplete    Radiology Studies:  CT ABDOMEN PELVIS W CONTRAST  Result Date:  04/13/2023 CLINICAL DATA:  Left lower quadrant pain.  New transaminitis. EXAM: CT ABDOMEN AND PELVIS WITH CONTRAST TECHNIQUE: Multidetector CT imaging of the abdomen and pelvis was performed using the standard protocol following bolus administration of intravenous contrast. RADIATION DOSE REDUCTION: This exam was performed according to the departmental dose-optimization program which includes automated exposure control, adjustment of the mA and/or kV according to patient size and/or use of iterative reconstruction technique. CONTRAST:  55mL OMNIPAQUE IOHEXOL 350 MG/ML SOLN COMPARISON:  Report only CT abdomen and pelvis 02/11/2003 FINDINGS: Lower chest: No acute abnormality. Hepatobiliary: No focal liver abnormality is seen. No gallstones, gallbladder wall thickening, or biliary dilatation. Pancreas: Unremarkable. No pancreatic ductal dilatation or surrounding inflammatory changes. Spleen: Normal in size without focal abnormality. Adrenals/Urinary Tract: Adrenal glands are unremarkable. Kidneys are normal, without renal calculi, focal lesion, or hydronephrosis. Bladder is unremarkable. Stomach/Bowel: There is diffuse colonic diverticulosis. There is wall thickening and inflammation  of the distal descending colon most compatible with diverticulitis. There are small bubbles of extraluminal gas with surrounding ill-defined fluid adjacent to this portion of the colon measuring 3.5 x 2.4 x 2.7 cm image 3/57. There is no bowel obstruction. The appendix is not seen. Small bowel loops are within normal limits. Vascular/Lymphatic: No significant vascular findings are present. No enlarged abdominal or pelvic lymph nodes. Reproductive: Status post hysterectomy. No adnexal masses. Other: No abdominal wall hernia or abnormality. No abdominopelvic ascites. Musculoskeletal: No acute or significant osseous findings. IMPRESSION: Acute diverticulitis of the distal descending colon. There is a small amount of extraluminal gas and  ill-defined fluid adjacent to this portion of the colon worrisome for perforation and developing abscess. Electronically Signed   By: Darliss Cheney M.D.   On: 04/13/2023 15:29    Scheduled Meds:    acidophilus  2 capsule Oral TID    Continuous Infusions:    sodium chloride 125 mL/hr at 04/14/23 0351   piperacillin-tazobactam (ZOSYN)  IV 3.375 g (04/14/23 0852)   potassium chloride 10 mEq (04/14/23 0854)     LOS: 1 day     Marcellus Scott, MD,  FACP, FHM, [facility], [facility], [facility]   [facility] [place]     To contact the attending provider between 7A-7P or the covering provider during after hours 7P-7A, please log into the web site [email or web address] and access using universal [place] password for that web site. If you do not have the password, please call the hospital operator.  04/14/2023, 10:06 AM

## 2023-04-14 NOTE — Consult Note (Signed)
CC: lower abdominal pain  Requesting provider: Dr Chipper Herb  HPI: Sarah Waters is an 62 y.o. female who is here for evaluation for persistent lower abdominal pain.  She states her discomfort initially started on Monday.  She had what she thought was indigestion and started taking over-the-counter medications without improvement.  The pain was fairly constant.  It is in her lower abdomen but more on the left side.  No fever or chills.  Maybe a little bit of nausea but no vomiting.  Last bowel movement was on Thursday.  She typically has a bowel movement daily but does have a remote history of constipation that she would take MiraLAX for.  No weight change.  No melena hematochezia.  No dysuria or hematuria.  No prior abdominal surgery.  No prior colonoscopy.  States that she did do Cologuard last year which was normal.  Pain has been constant and mainly in her lower abdomen without radiation.  She reports that her discomfort is better today then when she came into the emergency room  No past medical history on file. HLD Hormone replacement  PSHx- denies abd surgery   No family history on file.  Social:  reports that she has never smoked. She does not have any smokeless tobacco history on file. She reports that she does not currently use alcohol. She reports that she does not currently use drugs.  Allergies:  Allergies  Allergen Reactions   Apple (Diagnostic) Itching   Cherry Itching   Peach Flavor Itching   Pear Itching   Plum Pulp Itching   Cantaloupe (Diagnostic) Rash    Medications: I have reviewed the patient's current medications.   ROS - all of the below systems have been reviewed with the patient and positives are indicated with bold text General: chills, fever or night sweats Eyes: blurry vision or double vision ENT: epistaxis or sore throat Allergy/Immunology: itchy/watery eyes or nasal congestion Hematologic/Lymphatic: bleeding problems, blood clots or swollen lymph  nodes Endocrine: temperature intolerance or unexpected weight changes Breast: new or changing breast lumps or nipple discharge Resp: cough, shortness of breath, or wheezing CV: chest pain or dyspnea on exertion GI: as per HPI GU: dysuria, trouble voiding, or hematuria MSK: joint pain or joint stiffness Neuro: TIA or stroke symptoms Derm: pruritus and skin lesion changes Psych: anxiety and depression  PE Blood pressure (!) 111/58, pulse 86, temperature 98.1 F (36.7 C), temperature source Oral, resp. rate 17, height 5' (1.524 m), weight 53.8 kg, SpO2 97 %. Constitutional: NAD; conversant; no deformities Eyes: Moist conjunctiva; no lid lag; anicteric; PERRL Neck: Trachea midline; no thyromegaly Lungs: Normal respiratory effort; no tactile fremitus CV: RRR; no palpable thrills; no pitting edema GI: Abd soft, nd, TTP LLQ; no rebound/guarding; no palpable hepatosplenomegaly MSK: Normal gait; no clubbing/cyanosis Psychiatric: Appropriate affect; alert and oriented x3 Lymphatic: No palpable cervical or axillary lymphadenopathy Skin:no rash/lesions/jaundice  Results for orders placed or performed during the hospital encounter of 04/13/23 (from the past 48 hour(s))  Lipase, blood     Status: None   Collection Time: 04/13/23 11:38 AM  Result Value Ref Range   Lipase 33 11 - 51 U/L    Comment: Performed at [facility], [address], [place], [place] [address]  Comprehensive metabolic panel     Status: Abnormal   Collection Time: 04/13/23 11:38 AM  Result Value Ref Range   Sodium 131 (L) 135 - 145 mmol/L   Potassium 3.5 3.5 - 5.1 mmol/L   Chloride  97 (L) 98 - 111 mmol/L   CO2 25 22 - 32 mmol/L   Glucose, Bld 118 (H) 70 - 99 mg/dL    Comment: Glucose reference range applies only to samples taken after fasting for at least 8 hours.   BUN 6 (L) 8 - 23 mg/dL   Creatinine, Ser 1.61 0.44 - 1.00 mg/dL   Calcium 8.7 (L) 8.9 - 10.3 mg/dL   Total Protein 7.2 6.5 - 8.1 g/dL   Albumin  3.3 (L) 3.5 - 5.0 g/dL   AST 56 (H) 15 - 41 U/L   ALT 161 (H) 0 - 44 U/L   Alkaline Phosphatase 333 (H) 38 - 126 U/L   Total Bilirubin 0.6 0.3 - 1.2 mg/dL   GFR, Estimated >09 >60 mL/min    Comment: (NOTE) Calculated using the CKD-EPI Creatinine Equation (2021)    Anion gap 9 5 - 15    Comment: Performed at [facility], [address], [place], [place] [address]  CBC     Status: Abnormal   Collection Time: 04/13/23 11:38 AM  Result Value Ref Range   WBC 10.2 4.0 - 10.5 K/uL   RBC 4.72 3.87 - 5.11 MIL/uL   Hemoglobin 11.8 (L) 12.0 - 15.0 g/dL   HCT 81.1 (L) 91.4 - 78.2 %   MCV 75.4 (L) 80.0 - 100.0 fL   MCH 25.0 (L) 26.0 - 34.0 pg   MCHC 33.1 30.0 - 36.0 g/dL   RDW 95.6 21.3 - 08.6 %   Platelets 334 150 - 400 K/uL   nRBC 0.0 0.0 - 0.2 %    Comment: Performed at [facility], [address], [place], [place] [address]  Urinalysis, Routine w reflex microscopic -Urine, Clean Catch     Status: Abnormal   Collection Time: 04/13/23 11:55 AM  Result Value Ref Range   Color, Urine STRAW (A) YELLOW   APPearance CLEAR CLEAR   Specific Gravity, Urine 1.003 (L) 1.005 - 1.030   pH 8.0 5.0 - 8.0   Glucose, UA NEGATIVE NEGATIVE mg/dL   Hgb urine dipstick SMALL (A) NEGATIVE   Bilirubin Urine NEGATIVE NEGATIVE   Ketones, ur NEGATIVE NEGATIVE mg/dL   Protein, ur NEGATIVE NEGATIVE mg/dL   Nitrite NEGATIVE NEGATIVE   Leukocytes,Ua NEGATIVE NEGATIVE   RBC / HPF 0-5 0 - 5 RBC/hpf   WBC, UA 0-5 0 - 5 WBC/hpf   Bacteria, UA RARE (A) NONE SEEN   Squamous Epithelial / HPF 0-5 0 - 5 /HPF    Comment: Performed at [facility], [address] [place], [place] [address]  Acetaminophen level     Status: Abnormal   Collection Time: 04/13/23  1:46 PM  Result Value Ref Range   Acetaminophen (Tylenol), Serum <10 (L) 10 - 30 ug/mL    Comment: (NOTE) Therapeutic concentrations vary significantly. A range of 10-30 ug/mL  may be an effective concentration for many patients. However,  some  are best treated at concentrations outside of this range. Acetaminophen concentrations >150 ug/mL at 4 hours after ingestion  and >50 ug/mL at 12 hours after ingestion are often associated with  toxic reactions.  Performed at [facility], [address], [place], [place] [address]   Hepatitis panel, acute     Status: None   Collection Time: 04/13/23  8:42 PM  Result Value Ref Range   Hepatitis B Surface Ag NON REACTIVE NON REACTIVE   HCV Ab NON REACTIVE NON REACTIVE  Comment: (NOTE) Nonreactive HCV antibody screen is consistent with no HCV infections,  unless recent infection is suspected or other evidence exists to indicate HCV infection.     Hep A IgM NON REACTIVE NON REACTIVE   Hep B C IgM NON REACTIVE NON REACTIVE    Comment: Performed at [facility], [address], [place], [place] [address]  HIV Antibody (routine testing w rflx)     Status: None   Collection Time: 04/13/23  8:42 PM  Result Value Ref Range   HIV Screen 4th Generation wRfx Non Reactive Non Reactive    Comment: Performed at [facility], [address] [place], [place] [address]  Hepatic function panel     Status: Abnormal   Collection Time: 04/14/23 12:28 AM  Result Value Ref Range   Total Protein 7.1 6.5 - 8.1 g/dL   Albumin 3.1 (L) 3.5 - 5.0 g/dL   AST 34 15 - 41 U/L   ALT 126 (H) 0 - 44 U/L   Alkaline Phosphatase 287 (H) 38 - 126 U/L   Total Bilirubin 0.7 0.3 - 1.2 mg/dL   Bilirubin, Direct 0.1 0.0 - 0.2 mg/dL   Indirect Bilirubin 0.6 0.3 - 0.9 mg/dL    Comment: Performed at [facility], [address], [place], [place] [address]  CBC     Status: Abnormal   Collection Time: 04/14/23 12:28 AM  Result Value Ref Range   WBC 12.6 (H) 4.0 - 10.5 K/uL   RBC 4.52 3.87 - 5.11 MIL/uL   Hemoglobin 11.0 (L) 12.0 - 15.0 g/dL   HCT 91.4 (L) 78.2 - 95.6 %   MCV 75.2 (L) 80.0 - 100.0 fL   MCH 24.3 (L) 26.0 - 34.0 pg   MCHC 32.4 30.0 - 36.0 g/dL   RDW 21.3 08.6 - 57.8 %    Platelets 339 150 - 400 K/uL   nRBC 0.0 0.0 - 0.2 %    Comment: Performed at [facility], [address], [place], [place] [address]  Basic metabolic panel     Status: Abnormal   Collection Time: 04/14/23 12:28 AM  Result Value Ref Range   Sodium 138 135 - 145 mmol/L    Comment: DELTA CHECK NOTED   Potassium 3.1 (L) 3.5 - 5.1 mmol/L   Chloride 106 98 - 111 mmol/L   CO2 22 22 - 32 mmol/L   Glucose, Bld 113 (H) 70 - 99 mg/dL    Comment: Glucose reference range applies only to samples taken after fasting for at least 8 hours.   BUN 6 (L) 8 - 23 mg/dL   Creatinine, Ser 9.52 0.44 - 1.00 mg/dL   Calcium 8.2 (L) 8.9 - 10.3 mg/dL   GFR, Estimated >84 >13 mL/min    Comment: (NOTE) Calculated using the CKD-EPI Creatinine Equation (2021)    Anion gap 10 5 - 15    Comment: Performed at [facility], [address] [place], [place] [address]    CT ABDOMEN PELVIS W CONTRAST  Result Date: 04/13/2023 CLINICAL DATA:  Left lower quadrant pain.  New transaminitis. EXAM: CT ABDOMEN AND PELVIS WITH CONTRAST TECHNIQUE: Multidetector CT imaging of the abdomen and pelvis was performed using the standard protocol following bolus administration of intravenous contrast. RADIATION DOSE REDUCTION: This exam was performed according to the departmental dose-optimization program which includes automated exposure control, adjustment of the mA and/or kV according to patient size and/or use of iterative reconstruction technique. CONTRAST:  55mL OMNIPAQUE IOHEXOL 350 MG/ML SOLN COMPARISON:  Report only CT abdomen and pelvis 02/11/2003 FINDINGS: Lower chest: No acute abnormality. Hepatobiliary: No focal liver abnormality is seen. No gallstones, gallbladder wall thickening, or biliary dilatation. Pancreas: Unremarkable. No pancreatic ductal dilatation or surrounding inflammatory changes. Spleen: Normal in size without focal abnormality. Adrenals/Urinary Tract: Adrenal glands are unremarkable. Kidneys are normal,  without renal calculi, focal lesion, or hydronephrosis. Bladder is unremarkable. Stomach/Bowel: There is diffuse colonic diverticulosis. There is wall thickening and inflammation of the distal descending colon most compatible with diverticulitis. There are small bubbles of extraluminal gas with surrounding ill-defined fluid adjacent to this portion of the colon measuring 3.5 x 2.4 x 2.7 cm image 3/57. There is no bowel obstruction. The appendix is not seen. Small bowel loops are within normal limits. Vascular/Lymphatic: No significant vascular findings are present. No enlarged abdominal or pelvic lymph nodes. Reproductive: Status post hysterectomy. No adnexal masses. Other: No abdominal wall hernia or abnormality. No abdominopelvic ascites. Musculoskeletal: No acute or significant osseous findings. IMPRESSION: Acute diverticulitis of the distal descending colon. There is a small amount of extraluminal gas and ill-defined fluid adjacent to this portion of the colon worrisome for perforation and developing abscess. Electronically Signed   By: Darliss Cheney M.D.   On: 04/13/2023 15:29    Imaging: Personally reviewed  A/P: Sarah Waters is an 62 y.o. female with  Complicated distal descending colon diverticulitis with phlegmon Hypokalemia Elevated LFTs H/o HLD Mild microcytic anemia  ID- rocephin/flagyl 5/31; zosyn 6/1 VTE prophylaxis -scds, rec starting heparin or lovenox  Agree with admission to inpt May have sips of clears  Given complicated diverticulitis - will change abx to zosyn Rec chemical vte prophylaxis Replace electrolytes Unclear etiology of elevated lfts, trending down, hep panel negative. No GS on CT. No RUQ pain; rec trend LFTs If has worsening pain, fever, worsening wbc over next few days - will need repeat ct to see if phlegmon now abscess/amenable to drain If able to get thru hospitalization without surgery patient will need outpatient follow-up with GI in 6 to 8 weeks for  colonoscopy  Updated husband at bedside Discussed diverticular disease.  Discussed the typical management and the typical hospitalization.  Did discuss indications for laparotomy with colostomy.  If able to get through hospitalization without surgery discussed the need for outpatient follow-up with GI for colonoscopy to rule out other etiology for inflammation and microperforation of the colon  We will follow  Data reviewed: Reviewed vitals for the past 12 hours, labs May 31, June 1; hospitalist H&P, ED notes, personally reviewed CT imaging, ED note from October 2021  High-level medical decision making  Mary Sella. Andrey Campanile, MD, FACS General, Bariatric, & Minimally Invasive Surgery [facility]

## 2023-04-14 NOTE — Plan of Care (Signed)

## 2023-04-15 DIAGNOSIS — K5792 Diverticulitis of intestine, part unspecified, without perforation or abscess without bleeding: Secondary | ICD-10-CM

## 2023-04-15 DIAGNOSIS — E876 Hypokalemia: Secondary | ICD-10-CM

## 2023-04-15 LAB — CBC
HCT: 33.1 % — ABNORMAL LOW (ref 36.0–46.0)
Hemoglobin: 10.4 g/dL — ABNORMAL LOW (ref 12.0–15.0)
MCH: 23.9 pg — ABNORMAL LOW (ref 26.0–34.0)
MCHC: 31.4 g/dL (ref 30.0–36.0)
MCV: 76.1 fL — ABNORMAL LOW (ref 80.0–100.0)
Platelets: 349 10*3/uL (ref 150–400)
RBC: 4.35 MIL/uL (ref 3.87–5.11)
RDW: 14.2 % (ref 11.5–15.5)
WBC: 14.8 10*3/uL — ABNORMAL HIGH (ref 4.0–10.5)
nRBC: 0 % (ref 0.0–0.2)

## 2023-04-15 LAB — CULTURE, BLOOD (ROUTINE X 2)
Special Requests: ADEQUATE
Special Requests: ADEQUATE

## 2023-04-15 LAB — COMPREHENSIVE METABOLIC PANEL
ALT: 82 U/L — ABNORMAL HIGH (ref 0–44)
AST: 16 U/L (ref 15–41)
Albumin: 2.6 g/dL — ABNORMAL LOW (ref 3.5–5.0)
Alkaline Phosphatase: 212 U/L — ABNORMAL HIGH (ref 38–126)
Anion gap: 12 (ref 5–15)
BUN: 5 mg/dL — ABNORMAL LOW (ref 8–23)
CO2: 16 mmol/L — ABNORMAL LOW (ref 22–32)
Calcium: 7.7 mg/dL — ABNORMAL LOW (ref 8.9–10.3)
Chloride: 107 mmol/L (ref 98–111)
Creatinine, Ser: 0.74 mg/dL (ref 0.44–1.00)
GFR, Estimated: >60 mL/min (ref >60)
Glucose, Bld: 87 mg/dL (ref 70–99)
Potassium: 3.3 mmol/L — ABNORMAL LOW (ref 3.5–5.1)
Sodium: 135 mmol/L (ref 135–145)
Total Bilirubin: 0.9 mg/dL (ref 0.3–1.2)
Total Protein: 6.3 g/dL — ABNORMAL LOW (ref 6.5–8.1)

## 2023-04-15 MED ORDER — LACTATED RINGERS IV SOLN: INTRAVENOUS | Status: DC

## 2023-04-15 MED ORDER — SODIUM CHLORIDE 0.9 % IV SOLN: INTRAVENOUS | Status: DC

## 2023-04-15 MED ORDER — ENOXAPARIN SODIUM 40 MG/0.4ML IJ SOSY
40.0000 mg | PREFILLED_SYRINGE | INTRAMUSCULAR | Status: DC
  Administered 2023-04-15 – 2023-04-17 (×3): 40 mg via SUBCUTANEOUS
  Filled 2023-04-15 (×5): qty 0.4

## 2023-04-15 MED ORDER — POTASSIUM CHLORIDE CRYS ER 20 MEQ PO TBCR
30.0000 meq | EXTENDED_RELEASE_TABLET | ORAL | Status: AC
  Administered 2023-04-15 (×2): 30 meq via ORAL
  Filled 2023-04-15 (×2): qty 1

## 2023-04-15 NOTE — Plan of Care (Signed)
  Problem: Education: Goal: Knowledge of General Education information will improve Description: Including pain rating scale, medication(s)/side effects and non-pharmacologic comfort measures Outcome: Progressing   Problem: Health Behavior/Discharge Planning: Goal: Ability to manage health-related needs will improve Outcome: Progressing   Problem: Activity: Goal: Risk for activity intolerance will decrease Outcome: Progressing   Problem: Nutrition: Goal: Adequate nutrition will be maintained Outcome: Progressing   Problem: Elimination: Goal: Will not experience complications related to bowel motility Outcome: Progressing   Problem: Pain Managment: Goal: General experience of comfort will improve Outcome: Progressing   Problem: Safety: Goal: Ability to remain free from injury will improve Outcome: Progressing   

## 2023-04-15 NOTE — Progress Notes (Signed)
Assessment & Plan: HD#3 - complicated distal descending colon diverticulitis with phlegmon  IV Zosyn  Begin clear liquid diet today  Encouraged OOB, ambulation  WBC 14.8 Hypokalemia Elevated LFTs H/o HLD Mild microcytic anemia   ID- rocephin/flagyl 5/31; zosyn 6/1 VTE prophylaxis -scds, rec starting heparin or lovenox   If patient has worsening pain, fever, increasing wbc over next few days - will need repeat ct to see if phlegmon develops into abscess and is amenable to drainage by IR.         Darnell Level, MD [facility] practice Office: [phone]        Chief Complaint: Acute diverticulitis with phlegmon  Subjective: Patient in bed, pain slightly improved.  Denies nausea.  Passing flatus.  Family in room.  Objective: Vital signs in last 24 hours: Temp:  [97.8 F (36.6 C)-98.7 F (37.1 C)] 97.8 F (36.6 C) (06/02 0757) Pulse Rate:  [77-88] 79 (06/02 0757) Resp:  [16] 16 (06/02 0757) BP: (98-123)/(56-67) 98/56 (06/02 0757) SpO2:  [97 %-100 %] 98 % (06/02 0757) Last BM Date : 04/12/23 (per pt)  Intake/Output from previous day: 06/01 0701 - 06/02 0700 In: 1120.7 [P.O.:20; I.V.:1000.7; IV Piggyback:100] Out: -  Intake/Output this shift: No intake/output data recorded.  Physical Exam: HEENT - sclerae clear, mucous membranes moist Abdomen - soft without distension; mild tenderness LLQ with guarding, no mass Ext - no edema, non-tender Neuro - alert & oriented, no focal deficits  Lab Results:  Recent Labs    04/14/23 0028 04/15/23 0026  WBC 12.6* 14.8*  HGB 11.0* 10.4*  HCT 34.0* 33.1*  PLT 339 349   BMET Recent Labs    04/14/23 0028 04/15/23 0026  NA 138 135  K 3.1* 3.3*  CL 106 107  CO2 22 16*  GLUCOSE 113* 87  BUN 6* 5*  CREATININE 0.64 0.74  CALCIUM 8.2* 7.7*   PT/INR No results for input(s): "LABPROT", "INR" in the last 72 hours. Comprehensive Metabolic Panel:    Component Value Date/Time   NA 135  04/15/2023 0026   NA 138 04/14/2023 0028   K 3.3 (L) 04/15/2023 0026   K 3.1 (L) 04/14/2023 0028   CL 107 04/15/2023 0026   CL 106 04/14/2023 0028   CO2 16 (L) 04/15/2023 0026   CO2 22 04/14/2023 0028   BUN 5 (L) 04/15/2023 0026   BUN 6 (L) 04/14/2023 0028   CREATININE 0.74 04/15/2023 0026   CREATININE 0.64 04/14/2023 0028   GLUCOSE 87 04/15/2023 0026   GLUCOSE 113 (H) 04/14/2023 0028   CALCIUM 7.7 (L) 04/15/2023 0026   CALCIUM 8.2 (L) 04/14/2023 0028   AST 16 04/15/2023 0026   AST 34 04/14/2023 0028   ALT 82 (H) 04/15/2023 0026   ALT 126 (H) 04/14/2023 0028   ALKPHOS 212 (H) 04/15/2023 0026   ALKPHOS 287 (H) 04/14/2023 0028   BILITOT 0.9 04/15/2023 0026   BILITOT 0.7 04/14/2023 0028   PROT 6.3 (L) 04/15/2023 0026   PROT 7.1 04/14/2023 0028   ALBUMIN 2.6 (L) 04/15/2023 0026   ALBUMIN 3.1 (L) 04/14/2023 0028    Studies/Results: CT ABDOMEN PELVIS W CONTRAST  Result Date: 04/13/2023 CLINICAL DATA:  Left lower quadrant pain.  New transaminitis. EXAM: CT ABDOMEN AND PELVIS WITH CONTRAST TECHNIQUE: Multidetector CT imaging of the abdomen and pelvis was performed using the standard protocol following bolus administration of intravenous contrast. RADIATION DOSE REDUCTION: This exam was performed according to the departmental dose-optimization program which includes  automated exposure control, adjustment of the mA and/or kV according to patient size and/or use of iterative reconstruction technique. CONTRAST:  55mL OMNIPAQUE IOHEXOL 350 MG/ML SOLN COMPARISON:  Report only CT abdomen and pelvis 02/11/2003 FINDINGS: Lower chest: No acute abnormality. Hepatobiliary: No focal liver abnormality is seen. No gallstones, gallbladder wall thickening, or biliary dilatation. Pancreas: Unremarkable. No pancreatic ductal dilatation or surrounding inflammatory changes. Spleen: Normal in size without focal abnormality. Adrenals/Urinary Tract: Adrenal glands are unremarkable. Kidneys are normal, without renal  calculi, focal lesion, or hydronephrosis. Bladder is unremarkable. Stomach/Bowel: There is diffuse colonic diverticulosis. There is wall thickening and inflammation of the distal descending colon most compatible with diverticulitis. There are small bubbles of extraluminal gas with surrounding ill-defined fluid adjacent to this portion of the colon measuring 3.5 x 2.4 x 2.7 cm image 3/57. There is no bowel obstruction. The appendix is not seen. Small bowel loops are within normal limits. Vascular/Lymphatic: No significant vascular findings are present. No enlarged abdominal or pelvic lymph nodes. Reproductive: Status post hysterectomy. No adnexal masses. Other: No abdominal wall hernia or abnormality. No abdominopelvic ascites. Musculoskeletal: No acute or significant osseous findings. IMPRESSION: Acute diverticulitis of the distal descending colon. There is a small amount of extraluminal gas and ill-defined fluid adjacent to this portion of the colon worrisome for perforation and developing abscess. Electronically Signed   By: Darliss Cheney M.D.   On: 04/13/2023 15:29      Darnell Level 04/15/2023  Patient ID: Sarah Waters, female   DOB: 19-May-1961, 62 y.o.   MRN: [id number]

## 2023-04-15 NOTE — Progress Notes (Signed)
PROGRESS NOTE   Sarah Waters  ZOX:[id number]    DOB: Mar 19, 1961    DOA: 04/13/2023  PCP: Soundra Pilon, FNP   I have briefly reviewed patients previous medical records in [facility].  Chief Complaint  Patient presents with   Abdominal Pain    Brief Narrative:  62 year old married female with PMH of HLD, chronic hormone replacement therapy, presented to the ED on 04/13/2023 with complaints of 5 days history of pain across lower abdomen, worse in LLQ with subjective fevers, chills, symptoms unresolved after OTC meds for presumed indigestion, and last BM 2 days PTA.  Never had a colonoscopy but had Cologuard which was negative.  Admitted for distal descending colon acute diverticulitis with perforation and developing abscess.  General surgery consulted, managing conservatively for now.   Assessment & Plan:  Principal Problem:   Diverticulitis of both large and small intestine with abscess Active Problems:   Diverticulitis   HLD (hyperlipidemia)   Distal descending colon acute diverticulitis with perforation and developing abscess: CT A/P 5/31: Confirms above. Never had colonoscopy.  Had negative Cologuard 2023 per patient's report General surgery consultation appreciated, for now managing conservatively: starting clear liquid diet, IV Zosyn, IVF, multimodality pain control. Mobilize, discussed with patient. If worsens then may need repeat CT and percutaneous drain by IR.  Hopefully will not need surgery. Pending stabilization and discharge, will need follow-up with GI in 6 to 8 weeks for colonoscopy.  This was discussed in detail with patient and spouse at bedside and they verbalized understanding. Blood cultures x 2: Negative to date.  Hypokalemia: Replace aggressively and follow.  Magnesium normal.  Microcytic anemia: Stable.  Outpatient follow-up with PCP.  Transaminitis: Unclear etiology. Acute hepatitis panel and HIV screen negative.  Acetaminophen level  negative. No acute hepatobiliary findings on CT A/P 5/31. Had normal AST and ALT in October 2021. ?  Related to acute infectious process above.  Slowly improving. Trend CMP.  Holding statins. Improved.  Hyperlipidemia Holding statins due to abnormal LFTs. Resume at DC.  Dehydration with hyponatremia Resolved.  Gentle IV fluids for additional 24 hours.  Low bicarb: Bicarb down to 16, anion gap normal.  Unclear etiology,?  Lab error.  Continue LR infusion.  Follow BMP in AM.  Body mass index is 23.16 kg/m.    ACP Documents: None present. DVT prophylaxis: Place and maintain sequential compression device Start: 04/13/23 1852     Code Status: Full Code:  Family Communication: Spouse at bedside today. Disposition:  Status is: Inpatient Remains inpatient appropriate because: Bowel rest, IV fluids and clinical monitoring for acute diverticulitis.     Consultants:   General surgery.  Procedures:     Antimicrobials:   IV Zosyn   Subjective:  Per nursing, had an episode of mild nausea and clear liquid emesis.  Abdominal pain better.  Passing flatus and abdominal pain improves after that.  No BM.  Objective:   Vitals:   04/14/23 1457 04/14/23 2035 04/15/23 0524 04/15/23 0757  BP: 123/67 115/67 100/63 (!) 98/56  Pulse: 80 88 77 79  Resp:  16 16 16   Temp: 98.7 F (37.1 C) 98.7 F (37.1 C) 98.4 F (36.9 C) 97.8 F (36.6 C)  TempSrc: Oral Oral Oral Oral  SpO2: 99% 97% 100% 98%  Weight:      Height:        General exam: Young female, moderately built and nourished lying comfortably supine in bed without distress.  Oral mucosa with borderline hydration. Respiratory system:  Clear to auscultation. Respiratory effort normal. Cardiovascular system: S1 & S2 heard, RRR. No JVD, murmurs, rubs, gallops or clicks. No pedal edema. Gastrointestinal system: Abdomen is nondistended, soft.  Less tender in the suprapubic and  left lower quadrant on deep palpation.  No guarding,  rigidity or rebound. No organomegaly or masses felt. Normal bowel sounds heard. Central nervous system: Alert and oriented. No focal neurological deficits. Extremities: Symmetric 5 x 5 power. Skin: No rashes, lesions or ulcers Psychiatry: Judgement and insight appear normal. Mood & affect appropriate.     Data Reviewed:   I have personally reviewed following labs and imaging studies   CBC: Recent Labs  Lab 04/13/23 1138 04/14/23 0028 04/15/23 0026  WBC 10.2 12.6* 14.8*  HGB 11.8* 11.0* 10.4*  HCT 35.6* 34.0* 33.1*  MCV 75.4* 75.2* 76.1*  PLT 334 339 349    Basic Metabolic Panel: Recent Labs  Lab 04/13/23 1138 04/14/23 0028 04/15/23 0026  NA 131* 138 135  K 3.5 3.1* 3.3*  CL 97* 106 107  CO2 25 22 16*  GLUCOSE 118* 113* 87  BUN 6* 6* 5*  CREATININE 0.67 0.64 0.74  CALCIUM 8.7* 8.2* 7.7*  MG  --  2.1  --     Liver Function Tests: Recent Labs  Lab 04/13/23 1138 04/14/23 0028 04/15/23 0026  AST 56* 34 16  ALT 161* 126* 82*  ALKPHOS 333* 287* 212*  BILITOT 0.6 0.7 0.9  PROT 7.2 7.1 6.3*  ALBUMIN 3.3* 3.1* 2.6*    CBG: No results for input(s): "GLUCAP" in the last 168 hours.  Microbiology Studies:   Recent Results (from the past 240 hour(s))  Blood culture (routine x 2)     Status: None (Preliminary result)   Collection Time: 04/13/23  4:31 PM   Specimen: BLOOD  Result Value Ref Range Status   Specimen Description BLOOD RIGHT ANTECUBITAL  Final   Special Requests   Final    BOTTLES DRAWN AEROBIC AND ANAEROBIC Blood Culture adequate volume   Culture   Final    NO GROWTH 2 DAYS Performed at [facility], [address] [place], [place] [address]    Report Status PENDING  Incomplete  Blood culture (routine x 2)     Status: None (Preliminary result)   Collection Time: 04/13/23  4:50 PM   Specimen: BLOOD RIGHT HAND  Result Value Ref Range Status   Specimen Description BLOOD RIGHT HAND  Final   Special Requests   Final    BOTTLES DRAWN  AEROBIC AND ANAEROBIC Blood Culture adequate volume   Culture   Final    NO GROWTH 2 DAYS Performed at [facility], [address], [place], [place] [address]    Report Status PENDING  Incomplete    Radiology Studies:  CT ABDOMEN PELVIS W CONTRAST  Result Date: 04/13/2023 CLINICAL DATA:  Left lower quadrant pain.  New transaminitis. EXAM: CT ABDOMEN AND PELVIS WITH CONTRAST TECHNIQUE: Multidetector CT imaging of the abdomen and pelvis was performed using the standard protocol following bolus administration of intravenous contrast. RADIATION DOSE REDUCTION: This exam was performed according to the departmental dose-optimization program which includes automated exposure control, adjustment of the mA and/or kV according to patient size and/or use of iterative reconstruction technique. CONTRAST:  55mL OMNIPAQUE IOHEXOL 350 MG/ML SOLN COMPARISON:  Report only CT abdomen and pelvis 02/11/2003 FINDINGS: Lower chest: No acute abnormality. Hepatobiliary: No focal liver abnormality is seen. No gallstones, gallbladder wall thickening, or biliary dilatation. Pancreas:  Unremarkable. No pancreatic ductal dilatation or surrounding inflammatory changes. Spleen: Normal in size without focal abnormality. Adrenals/Urinary Tract: Adrenal glands are unremarkable. Kidneys are normal, without renal calculi, focal lesion, or hydronephrosis. Bladder is unremarkable. Stomach/Bowel: There is diffuse colonic diverticulosis. There is wall thickening and inflammation of the distal descending colon most compatible with diverticulitis. There are small bubbles of extraluminal gas with surrounding ill-defined fluid adjacent to this portion of the colon measuring 3.5 x 2.4 x 2.7 cm image 3/57. There is no bowel obstruction. The appendix is not seen. Small bowel loops are within normal limits. Vascular/Lymphatic: No significant vascular findings are present. No enlarged abdominal or pelvic lymph nodes. Reproductive: Status post  hysterectomy. No adnexal masses. Other: No abdominal wall hernia or abnormality. No abdominopelvic ascites. Musculoskeletal: No acute or significant osseous findings. IMPRESSION: Acute diverticulitis of the distal descending colon. There is a small amount of extraluminal gas and ill-defined fluid adjacent to this portion of the colon worrisome for perforation and developing abscess. Electronically Signed   By: Darliss Cheney M.D.   On: 04/13/2023 15:29    Scheduled Meds:    acidophilus  2 capsule Oral TID   Brimonidine Tartrate  1 drop Left Eye Daily   estradiol  0.5 mg Oral Daily   Polyethyl Glyc-Propyl Glyc PF  1 drop Left Eye BID    Continuous Infusions:    sodium chloride Stopped (04/15/23 0949)   sodium chloride 50 mL/hr at 04/15/23 0949   piperacillin-tazobactam (ZOSYN)  IV 3.375 g (04/15/23 0523)     LOS: 2 days     Marcellus Scott, MD,  FACP, [facility], [facility], [facility], [facility]   [facility] [place]     To contact the attending provider between 7A-7P or the covering provider during after hours 7P-7A, please log into the web site [email or web address] and access using universal [place] password for that web site. If you do not have the password, please call the hospital operator.  04/15/2023, 10:43 AM

## 2023-04-16 DIAGNOSIS — E876 Hypokalemia: Secondary | ICD-10-CM | POA: Diagnosis not present

## 2023-04-16 DIAGNOSIS — K5792 Diverticulitis of intestine, part unspecified, without perforation or abscess without bleeding: Secondary | ICD-10-CM | POA: Diagnosis not present

## 2023-04-16 LAB — COMPREHENSIVE METABOLIC PANEL
ALT: 63 U/L — ABNORMAL HIGH (ref 0–44)
AST: 18 U/L (ref 15–41)
Albumin: 2.5 g/dL — ABNORMAL LOW (ref 3.5–5.0)
Alkaline Phosphatase: 178 U/L — ABNORMAL HIGH (ref 38–126)
Anion gap: 12 (ref 5–15)
BUN: 5 mg/dL — ABNORMAL LOW (ref 8–23)
CO2: 24 mmol/L (ref 22–32)
Calcium: 8 mg/dL — ABNORMAL LOW (ref 8.9–10.3)
Chloride: 100 mmol/L (ref 98–111)
Creatinine, Ser: 0.63 mg/dL (ref 0.44–1.00)
GFR, Estimated: >60 mL/min (ref >60)
Glucose, Bld: 159 mg/dL — ABNORMAL HIGH (ref 70–99)
Potassium: 3.5 mmol/L (ref 3.5–5.1)
Sodium: 136 mmol/L (ref 135–145)
Total Bilirubin: 0.7 mg/dL (ref 0.3–1.2)
Total Protein: 6 g/dL — ABNORMAL LOW (ref 6.5–8.1)

## 2023-04-16 LAB — CBC
HCT: 30.7 % — ABNORMAL LOW (ref 36.0–46.0)
Hemoglobin: 10.2 g/dL — ABNORMAL LOW (ref 12.0–15.0)
MCH: 24.8 pg — ABNORMAL LOW (ref 26.0–34.0)
MCHC: 33.2 g/dL (ref 30.0–36.0)
MCV: 74.7 fL — ABNORMAL LOW (ref 80.0–100.0)
Platelets: 344 10*3/uL (ref 150–400)
RBC: 4.11 MIL/uL (ref 3.87–5.11)
RDW: 14.1 % (ref 11.5–15.5)
WBC: 11.5 10*3/uL — ABNORMAL HIGH (ref 4.0–10.5)
nRBC: 0 % (ref 0.0–0.2)

## 2023-04-16 MED ORDER — POTASSIUM CHLORIDE CRYS ER 20 MEQ PO TBCR
40.0000 meq | EXTENDED_RELEASE_TABLET | Freq: Once | ORAL | Status: AC
  Administered 2023-04-16: 40 meq via ORAL
  Filled 2023-04-16: qty 2

## 2023-04-16 MED ORDER — VITAMIN D 25 MCG (1000 UNIT) PO TABS
5000.0000 [IU] | ORAL_TABLET | Freq: Every day | ORAL | Status: DC
  Administered 2023-04-16: 5000 [IU] via ORAL
  Filled 2023-04-16 (×4): qty 5

## 2023-04-16 MED ORDER — LACTATED RINGERS IV SOLN: INTRAVENOUS | Status: DC

## 2023-04-16 NOTE — Plan of Care (Signed)

## 2023-04-16 NOTE — Progress Notes (Signed)
Progress Note     Subjective: Pt reports some cramping abdominal pain in suprapubic abdomen that feels like she needs to pass flatus. Denies nausea but only taking some small sips at a time. She is passing some flatus but no BM. Overall pain slightly improved  Objective: Vital signs in last 24 hours: Temp:  [98.1 F (36.7 C)-98.3 F (36.8 C)] 98.2 F (36.8 C) (06/03 0828) Pulse Rate:  [69-86] 69 (06/03 0828) Resp:  [14-16] 16 (06/03 0828) BP: (118-138)/(68-75) 138/75 (06/03 0828) SpO2:  [99 %-100 %] 99 % (06/03 0828) Last BM Date : 04/12/23 (per pt)  Intake/Output from previous day: 06/02 0701 - 06/03 0700 In: 580.5 [I.V.:430.5; IV Piggyback:150] Out: 1 [Emesis/NG output:1] Intake/Output this shift: No intake/output data recorded.  PE: General: pleasant, WD, thin female who is laying in bed in NAD Heart: regular, rate, and rhythm.   Lungs: Respiratory effort nonlabored Abd: soft, NT, ND, +BS, no masses, hernias, or organomegaly Psych: A&Ox3 with an appropriate affect.    Lab Results:  Recent Labs    04/15/23 0026 04/16/23 0129  WBC 14.8* 11.5*  HGB 10.4* 10.2*  HCT 33.1* 30.7*  PLT 349 344   BMET Recent Labs    04/15/23 0026 04/16/23 0129  NA 135 136  K 3.3* 3.5  CL 107 100  CO2 16* 24  GLUCOSE 87 159*  BUN 5* 5*  CREATININE 0.74 0.63  CALCIUM 7.7* 8.0*   PT/INR No results for input(s): "LABPROT", "INR" in the last 72 hours. CMP     Component Value Date/Time   NA 136 04/16/2023 0129   K 3.5 04/16/2023 0129   CL 100 04/16/2023 0129   CO2 24 04/16/2023 0129   GLUCOSE 159 (H) 04/16/2023 0129   BUN 5 (L) 04/16/2023 0129   CREATININE 0.63 04/16/2023 0129   CALCIUM 8.0 (L) 04/16/2023 0129   PROT 6.0 (L) 04/16/2023 0129   ALBUMIN 2.5 (L) 04/16/2023 0129   AST 18 04/16/2023 0129   ALT 63 (H) 04/16/2023 0129   ALKPHOS 178 (H) 04/16/2023 0129   BILITOT 0.7 04/16/2023 0129   GFRNONAA >60 04/16/2023 0129   Lipase     Component Value Date/Time    LIPASE 33 04/13/2023 1138       Studies/Results: No results found.  Anti-infectives: Anti-infectives (From admission, onward)    Start     Dose/Rate Route Frequency Ordered Stop   04/14/23 1000  cefTRIAXone (ROCEPHIN) 2 g in sodium chloride 0.9 % 100 mL IVPB  Status:  Discontinued        2 g 200 mL/hr over 30 Minutes Intravenous Every 24 hours 04/13/23 1753 04/14/23 0553   04/14/23 0645  piperacillin-tazobactam (ZOSYN) IVPB 3.375 g        3.375 g 12.5 mL/hr over 240 Minutes Intravenous Every 8 hours 04/14/23 0559     04/14/23 0500  metroNIDAZOLE (FLAGYL) IVPB 500 mg  Status:  Discontinued        500 mg 100 mL/hr over 60 Minutes Intravenous Every 12 hours 04/13/23 1753 04/14/23 0553   04/13/23 1645  metroNIDAZOLE (FLAGYL) IVPB 500 mg        500 mg 100 mL/hr over 60 Minutes Intravenous  Once 04/13/23 1630 04/13/23 1847   04/13/23 1645  cefTRIAXone (ROCEPHIN) 1 g in sodium chloride 0.9 % 100 mL IVPB        1 g 200 mL/hr over 30 Minutes Intravenous  Once 04/13/23 1630 04/13/23 1755        Assessment/Plan  Complicated descending colon diverticulitis with phlegmon - WBC  11 from 14 yesterday, afebrile and HD stable - tolerating CLD but having some cramping pain and no BM in several days - ok to continue CLD today, can have coffee with cream if she would like - mobilize - plan repeat CT with PO and IV contrast tomorrow AM, NPO after MN - no indication for emergent surgical intervention at this time  FEN: CLD, IVF per TRH VTE: LMWH ID: Zosyn   LOS: 3 days   I reviewed hospitalist notes, last 24 h vitals and pain scores, last 48 h intake and output, last 24 h labs and trends, and last 24 h imaging results.   Juliet Rude, [facility] 04/16/2023, 11:14 AM Please see Amion for pager number during day hours 7:00am-4:30pm

## 2023-04-16 NOTE — Plan of Care (Signed)

## 2023-04-16 NOTE — Progress Notes (Signed)
PROGRESS NOTE   Sarah Waters  ZOX:[id number]    DOB: 07/04/1961    DOA: 04/13/2023  PCP: Soundra Pilon, FNP   I have briefly reviewed patients previous medical records in [facility].  Chief Complaint  Patient presents with   Abdominal Pain    Brief Narrative:  62 year old married female with PMH of HLD, chronic hormone replacement therapy, presented to the ED on 04/13/2023 with complaints of 5 days history of pain across lower abdomen, worse in LLQ with subjective fevers, chills, symptoms unresolved after OTC meds for presumed indigestion, and last BM 2 days PTA.  Never had a colonoscopy but had Cologuard which was negative.  Admitted for distal descending colon acute diverticulitis with perforation and developing abscess.  General surgery consulted, managing conservatively for now.   Assessment & Plan:  Principal Problem:   Diverticulitis of both large and small intestine with abscess Active Problems:   Diverticulitis   HLD (hyperlipidemia)   Distal descending colon acute diverticulitis with perforation and developing abscess: CT A/P 5/31: Confirms above. Never had colonoscopy.  Had negative Cologuard 2023 per patient's report General surgery consultation appreciated, for now managing conservatively: starting clear liquid diet, IV Zosyn, IVF, multimodality pain control. Mobilize, discussed with patient. If worsens then may need repeat CT and percutaneous drain by IR.  Hopefully will not need surgery. Pending stabilization and discharge, will need follow-up with GI in 6 to 8 weeks for colonoscopy.  This was discussed in detail with patient and spouse at bedside and they verbalized understanding. Blood cultures x 2: Remain negative to date. More abdominal pain since yesterday, no BM, minimal flatus, vomiting x 1.  Continue all above management.  Await general surgery follow-up, may decide to leave her on current CLD given symptoms.  Hypokalemia: Better did continue to  replace as needed and follow.  Magnesium normal.  Microcytic anemia: Stable.  Outpatient follow-up with PCP.  Transaminitis: Unclear etiology. Acute hepatitis panel and HIV screen negative.  Acetaminophen level negative. No acute hepatobiliary findings on CT A/P 5/31. Had normal AST and ALT in October 2021. ?  Related to acute infectious process above.  Slowly improving. Trend CMP.  Holding statins. Improved.  Hyperlipidemia Holding statins due to abnormal LFTs. Resume at DC.  Dehydration with hyponatremia Resolved.  Gentle IV fluids while ongoing abdominal pain, poor oral intake and some vomiting.  Low bicarb: Likely a lab error.  Bicarb is normal.  Body mass index is 23.16 kg/m.    ACP Documents: None present. DVT prophylaxis: enoxaparin (LOVENOX) injection 40 mg Start: 04/15/23 1200 Place and maintain sequential compression device Start: 04/13/23 1852     Code Status: Full Code:  Family Communication: Spouse at bedside daily including today. Disposition:  Status is: Inpatient Remains inpatient appropriate because: Bowel rest, IV fluids and clinical monitoring for acute diverticulitis.     Consultants:   General surgery.  Procedures:     Antimicrobials:   IV Zosyn   Subjective:  Patient reports somewhat worsening pain since yesterday, has used both p.o. and IV opioids, drank some tea, clear soup and ate Jell-O.  Vomited x 1, nonbloody emesis.  Small amount of flatus.  No BM since hospital admission.  Just received pain meds prior to MD visit.  States that she has been mobilizing in the room.  Objective:   Vitals:   04/15/23 1641 04/15/23 1951 04/16/23 0453 04/16/23 0828  BP: 136/74 128/70 118/68 138/75  Pulse: 86 79 76 69  Resp: 15 16 14  16  Temp: 98.2 F (36.8 C) 98.1 F (36.7 C) 98.3 F (36.8 C) 98.2 F (36.8 C)  TempSrc: Oral Oral Oral   SpO2: 99% 100% 99% 99%  Weight:      Height:        General exam: Young female, moderately built and  nourished lying comfortably supine in bed without distress.  Oral mucosa moist Respiratory system: Clear to auscultation.  No increased work of breathing. Cardiovascular system: S1 & S2 heard, RRR. No JVD, murmurs, rubs, gallops or clicks. No pedal edema. Gastrointestinal system: Abdomen is nondistended, soft and nontender but she just received pain meds.  No organomegaly or masses appreciated.  Normal bowel sounds heard. Central nervous system: Alert and oriented. No focal neurological deficits. Extremities: Symmetric 5 x 5 power. Skin: No rashes, lesions or ulcers Psychiatry: Judgement and insight appear normal. Mood & affect appropriate.     Data Reviewed:   I have personally reviewed following labs and imaging studies   CBC: Recent Labs  Lab 04/14/23 0028 04/15/23 0026 04/16/23 0129  WBC 12.6* 14.8* 11.5*  HGB 11.0* 10.4* 10.2*  HCT 34.0* 33.1* 30.7*  MCV 75.2* 76.1* 74.7*  PLT 339 349 344    Basic Metabolic Panel: Recent Labs  Lab 04/13/23 1138 04/14/23 0028 04/15/23 0026 04/16/23 0129  NA 131* 138 135 136  K 3.5 3.1* 3.3* 3.5  CL 97* 106 107 100  CO2 25 22 16* 24  GLUCOSE 118* 113* 87 159*  BUN 6* 6* 5* 5*  CREATININE 0.67 0.64 0.74 0.63  CALCIUM 8.7* 8.2* 7.7* 8.0*  MG  --  2.1  --   --     Liver Function Tests: Recent Labs  Lab 04/13/23 1138 04/14/23 0028 04/15/23 0026 04/16/23 0129  AST 56* 34 16 18  ALT 161* 126* 82* 63*  ALKPHOS 333* 287* 212* 178*  BILITOT 0.6 0.7 0.9 0.7  PROT 7.2 7.1 6.3* 6.0*  ALBUMIN 3.3* 3.1* 2.6* 2.5*    CBG: No results for input(s): "GLUCAP" in the last 168 hours.  Microbiology Studies:   Recent Results (from the past 240 hour(s))  Blood culture (routine x 2)     Status: None (Preliminary result)   Collection Time: 04/13/23  4:31 PM   Specimen: BLOOD  Result Value Ref Range Status   Specimen Description BLOOD RIGHT ANTECUBITAL  Final   Special Requests   Final    BOTTLES DRAWN AEROBIC AND ANAEROBIC Blood  Culture adequate volume   Culture   Final    NO GROWTH 3 DAYS Performed at [facility], [address] [place], [place] [address]    Report Status PENDING  Incomplete  Blood culture (routine x 2)     Status: None (Preliminary result)   Collection Time: 04/13/23  4:50 PM   Specimen: BLOOD RIGHT HAND  Result Value Ref Range Status   Specimen Description BLOOD RIGHT HAND  Final   Special Requests   Final    BOTTLES DRAWN AEROBIC AND ANAEROBIC Blood Culture adequate volume   Culture   Final    NO GROWTH 3 DAYS Performed at [facility], [address] [place], [place] [address]    Report Status PENDING  Incomplete    Radiology Studies:  No results found.  Scheduled Meds:    acidophilus  2 capsule Oral TID   Brimonidine Tartrate  1 drop Left Eye Daily   cholecalciferol  5,000 Units Oral Daily   enoxaparin (LOVENOX) injection  40 mg Subcutaneous Q24H   estradiol  0.5 mg Oral Daily   Polyethyl Glyc-Propyl Glyc PF  1 drop Left Eye BID    Continuous Infusions:    lactated ringers 50 mL/hr at 04/16/23 0522   piperacillin-tazobactam (ZOSYN)  IV 3.375 g (04/16/23 0520)     LOS: 3 days     Marcellus Scott, MD,  FACP, [facility], [facility], [facility], [facility]   [facility] [place]     To contact the attending provider between 7A-7P or the covering provider during after hours 7P-7A, please log into the web site [email or web address] and access using universal [place] password for that web site. If you do not have the password, please call the hospital operator.  04/16/2023, 9:59 AM

## 2023-04-17 ENCOUNTER — Inpatient Hospital Stay (HOSPITAL_COMMUNITY): Payer: 59

## 2023-04-17 DIAGNOSIS — E876 Hypokalemia: Secondary | ICD-10-CM | POA: Diagnosis not present

## 2023-04-17 DIAGNOSIS — K5792 Diverticulitis of intestine, part unspecified, without perforation or abscess without bleeding: Secondary | ICD-10-CM | POA: Diagnosis not present

## 2023-04-17 LAB — MAGNESIUM: Magnesium: 2.1 mg/dL (ref 1.7–2.4)

## 2023-04-17 LAB — CBC
HCT: 32.2 % — ABNORMAL LOW (ref 36.0–46.0)
Hemoglobin: 10.7 g/dL — ABNORMAL LOW (ref 12.0–15.0)
MCH: 24.5 pg — ABNORMAL LOW (ref 26.0–34.0)
MCHC: 33.2 g/dL (ref 30.0–36.0)
MCV: 73.7 fL — ABNORMAL LOW (ref 80.0–100.0)
Platelets: 378 10*3/uL (ref 150–400)
RBC: 4.37 MIL/uL (ref 3.87–5.11)
RDW: 13.7 % (ref 11.5–15.5)
WBC: 8.6 10*3/uL (ref 4.0–10.5)
nRBC: 0 % (ref 0.0–0.2)

## 2023-04-17 LAB — COMPREHENSIVE METABOLIC PANEL
ALT: 59 U/L — ABNORMAL HIGH (ref 0–44)
AST: 28 U/L (ref 15–41)
Albumin: 2.7 g/dL — ABNORMAL LOW (ref 3.5–5.0)
Alkaline Phosphatase: 183 U/L — ABNORMAL HIGH (ref 38–126)
Anion gap: 8 (ref 5–15)
BUN: <5 mg/dL — ABNORMAL LOW (ref 8–23)
CO2: 26 mmol/L (ref 22–32)
Calcium: 8.4 mg/dL — ABNORMAL LOW (ref 8.9–10.3)
Chloride: 102 mmol/L (ref 98–111)
Creatinine, Ser: 0.58 mg/dL (ref 0.44–1.00)
GFR, Estimated: >60 mL/min (ref >60)
Glucose, Bld: 112 mg/dL — ABNORMAL HIGH (ref 70–99)
Potassium: 3.2 mmol/L — ABNORMAL LOW (ref 3.5–5.1)
Sodium: 136 mmol/L (ref 135–145)
Total Bilirubin: 0.6 mg/dL (ref 0.3–1.2)
Total Protein: 6.5 g/dL (ref 6.5–8.1)

## 2023-04-17 LAB — CULTURE, BLOOD (ROUTINE X 2)

## 2023-04-17 MED ORDER — POTASSIUM CHLORIDE CRYS ER 20 MEQ PO TBCR
40.0000 meq | EXTENDED_RELEASE_TABLET | ORAL | Status: AC
  Administered 2023-04-17 (×2): 40 meq via ORAL
  Filled 2023-04-17 (×2): qty 2

## 2023-04-17 MED ORDER — IOHEXOL 350 MG/ML SOLN
75.0000 mL | Freq: Once | INTRAVENOUS | Status: AC | PRN
  Administered 2023-04-17: 75 mL via INTRAVENOUS

## 2023-04-17 NOTE — Progress Notes (Addendum)
PROGRESS NOTE   Sarah Waters  UJW:[id number]    DOB: Oct 03, 1961    DOA: 04/13/2023  PCP: Soundra Pilon, FNP   I have briefly reviewed patients previous medical records in [facility].  Chief Complaint  Patient presents with   Abdominal Pain    Brief Narrative:  62 year old married female with PMH of HLD, chronic hormone replacement therapy, presented to the ED on 04/13/2023 with complaints of 5 days history of pain across lower abdomen, worse in LLQ with subjective fevers, chills, symptoms unresolved after OTC meds for presumed indigestion, and last BM 2 days PTA.  Never had a colonoscopy but had Cologuard which was negative.  Admitted for distal descending colon acute diverticulitis with perforation and developing abscess.  General surgery consulted, managing conservatively for now.  Slowly improving.   Assessment & Plan:  Principal Problem:   Diverticulitis of both large and small intestine with abscess Active Problems:   Diverticulitis   HLD (hyperlipidemia)   Distal descending colon acute diverticulitis with perforation and developing abscess: CT A/P 5/31: Confirms above. Never had colonoscopy.  Had negative Cologuard 2023 per patient's report General surgery consultation appreciated, for now managing conservatively: starting clear liquid diet, IV Zosyn, IVF, multimodality pain control. Mobilize, discussed with patient. If worsens then may need repeat CT and percutaneous drain by IR.  Hopefully will not need surgery. Pending stabilization and discharge, will need follow-up with GI in 6 to 8 weeks for colonoscopy.  This was discussed in detail with patient and spouse at bedside and they verbalized understanding. Blood cultures x 2: Remain negative to date. Has done better in the last 24 hours with improved pain, tolerating CLD, passing more flatus, multiple loose BMs.  Leukocytosis resolved.  No fevers.  Advancing to full liquid diet today.  Repeat CT abdomen from 6/4  pending.    Addendum: CT abdomen 6/4: Enlarging paracolic abscess adjacent to the inflamed proximal sigmoid colon, currently 22 cc and previously about 10 cc on 04/13/2023.  CCS consulted IR for enlarging paracolic abscess with plan to proceed with image guided drain placement 6/5.  Hypokalemia: Persistent issue.  Continue to replace aggressively and follow BMP.  Follow-up magnesium and replace as needed.  Microcytic anemia: Stable.  Outpatient follow-up with PCP.  Transaminitis: Unclear etiology. Acute hepatitis panel and HIV screen negative.  Acetaminophen level negative. No acute hepatobiliary findings on CT A/P 5/31. Had normal AST and ALT in October 2021. ?  Related to acute infectious process above.  Slowly improving and have almost normalized. Trend CMP.  Holding statins. Improved.  Hyperlipidemia Holding statins due to abnormal LFTs. Resume at DC.  This was discussed in detail with patient and spouse at bedside and they verbalized agreement and understanding.  Dehydration with hyponatremia Resolved.  Low bicarb: Resolved.  Body mass index is 23.16 kg/m.    ACP Documents: None present. DVT prophylaxis: enoxaparin (LOVENOX) injection 40 mg Start: 04/15/23 1200 Place and maintain sequential compression device Start: 04/13/23 1852     Code Status: Full Code:  Family Communication: Spouse at bedside Disposition:  Pending further clinical improvement, stable CT, tolerating advanced diet, hopeful discharge home on 6/5.     Consultants:   General surgery.  Procedures:     Antimicrobials:   IV Zosyn   Subjective:  States that after early a.m. pain meds from yesterday, did not require strong pain meds, pain improved, no further vomiting except with oral contrast this morning, tolerated liquid diet, passing lots of flatus, had multiple  liquid BMs.  Objective:   Vitals:   04/16/23 1631 04/16/23 2018 04/17/23 0431 04/17/23 0823  BP: 129/78 132/77 129/83 128/77   Pulse: 75 80 75 66  Resp: 16  16 15   Temp: 98.3 F (36.8 C) 98.2 F (36.8 C) 98.1 F (36.7 C) 98 F (36.7 C)  TempSrc: Oral Oral Oral   SpO2: 100% 100% 99% 100%  Weight:      Height:        General exam: Young female, moderately built and nourished lying comfortably supine in bed without distress.  Oral mucosa moist.  Appears to be in much better spirits and pleasant this morning. Respiratory system: Clear to auscultation.  No increased work of breathing. Cardiovascular system: S1 & S2 heard, RRR. No JVD, murmurs, rubs, gallops or clicks. No pedal edema. Gastrointestinal system: Abdomen is nondistended, soft and minimal tenderness in left lower quadrant and suprapubic area without peritoneal signs.  Normal bowel sounds heard. Central nervous system: Alert and oriented. No focal neurological deficits. Extremities: Symmetric 5 x 5 power. Skin: No rashes, lesions or ulcers Psychiatry: Judgement and insight appear normal. Mood & affect appropriate.     Data Reviewed:   I have personally reviewed following labs and imaging studies   CBC: Recent Labs  Lab 04/15/23 0026 04/16/23 0129 04/17/23 0027  WBC 14.8* 11.5* 8.6  HGB 10.4* 10.2* 10.7*  HCT 33.1* 30.7* 32.2*  MCV 76.1* 74.7* 73.7*  PLT 349 344 378    Basic Metabolic Panel: Recent Labs  Lab 04/13/23 1138 04/14/23 0028 04/15/23 0026 04/16/23 0129 04/17/23 0027  NA 131* 138 135 136 136  K 3.5 3.1* 3.3* 3.5 3.2*  CL 97* 106 107 100 102  CO2 25 22 16* 24 26  GLUCOSE 118* 113* 87 159* 112*  BUN 6* 6* 5* 5* <5*  CREATININE 0.67 0.64 0.74 0.63 0.58  CALCIUM 8.7* 8.2* 7.7* 8.0* 8.4*  MG  --  2.1  --   --   --     Liver Function Tests: Recent Labs  Lab 04/13/23 1138 04/14/23 0028 04/15/23 0026 04/16/23 0129 04/17/23 0027  AST 56* 34 16 18 28   ALT 161* 126* 82* 63* 59*  ALKPHOS 333* 287* 212* 178* 183*  BILITOT 0.6 0.7 0.9 0.7 0.6  PROT 7.2 7.1 6.3* 6.0* 6.5  ALBUMIN 3.3* 3.1* 2.6* 2.5* 2.7*     CBG: No results for input(s): "GLUCAP" in the last 168 hours.  Microbiology Studies:   Recent Results (from the past 240 hour(s))  Blood culture (routine x 2)     Status: None (Preliminary result)   Collection Time: 04/13/23  4:31 PM   Specimen: BLOOD  Result Value Ref Range Status   Specimen Description BLOOD RIGHT ANTECUBITAL  Final   Special Requests   Final    BOTTLES DRAWN AEROBIC AND ANAEROBIC Blood Culture adequate volume   Culture   Final    NO GROWTH 3 DAYS Performed at [facility], [address] [place], [place] [address]    Report Status PENDING  Incomplete  Blood culture (routine x 2)     Status: None (Preliminary result)   Collection Time: 04/13/23  4:50 PM   Specimen: BLOOD RIGHT HAND  Result Value Ref Range Status   Specimen Description BLOOD RIGHT HAND  Final   Special Requests   Final    BOTTLES DRAWN AEROBIC AND ANAEROBIC Blood Culture adequate volume   Culture   Final    NO GROWTH 3 DAYS  Performed at [facility], [address] [place], [place] [address]    Report Status PENDING  Incomplete    Radiology Studies:  No results found.  Scheduled Meds:    acidophilus  2 capsule Oral TID   Brimonidine Tartrate  1 drop Left Eye Daily   cholecalciferol  5,000 Units Oral Daily   enoxaparin (LOVENOX) injection  40 mg Subcutaneous Q24H   estradiol  0.5 mg Oral Daily   Polyethyl Glyc-Propyl Glyc PF  1 drop Left Eye BID    Continuous Infusions:    lactated ringers 50 mL/hr at 04/17/23 0138   piperacillin-tazobactam (ZOSYN)  IV 3.375 g (04/17/23 0457)     LOS: 4 days     Marcellus Scott, MD,  FACP, FHM, [facility], [facility], [facility]   [facility] [place]     To contact the attending provider between 7A-7P or the covering provider during after hours 7P-7A, please log into the web site [email or web address] and access using universal [place] password for that web site. If you do not have the password, please  call the hospital operator.  04/17/2023, 10:52 AM

## 2023-04-17 NOTE — TOC Initial Note (Signed)
Transition of Care (TOC) - Initial/Assessment Note   Spoke to patient and husband at bedside.   Patient has PCP and transportation to appointments and can get prescriptions filled .   Patient did not use any DME prior to admission. No discharge needs identified at this time  Patient Details  Name: Sarah Waters MRN: [id number] Date of Birth: [birth year]  Transition of Care [facility]) CM/SW Contact:    Kingsley Plan, RN Phone Number: 04/17/2023, 1:28 PM  Clinical Narrative:                   Expected Discharge Plan: Home/Self Care Barriers to Discharge: Continued Medical Work up   Patient Goals and CMS Choice Patient states their goals for this hospitalization and ongoing recovery are:: to return to home     [place] ownership interest in [facility].provided to:: Patient    Expected Discharge Plan and Services   Discharge Planning Services: CM Consult   Living arrangements for the past 2 months: Single Family Home                 DME Arranged: N/A         HH Arranged: NA          Prior Living Arrangements/Services Living arrangements for the past 2 months: Single Family Home Lives with:: Spouse Patient language and need for interpreter reviewed:: Yes Do you feel safe going back to the place where you live?: Yes      Need for Family Participation in Patient Care: Yes (Comment) Care giver support system in place?: Yes (comment)   Criminal Activity/Legal Involvement Pertinent to Current Situation/Hospitalization: No - Comment as needed  Activities of Daily Living      Permission Sought/Granted   Permission granted to share information with : No              Emotional Assessment Appearance:: Appears stated age Attitude/Demeanor/Rapport: Engaged Affect (typically observed): Accepting Orientation: : Oriented to Self, Oriented to Place, Oriented to  Time, Oriented to Situation Alcohol / Substance Use: Not Applicable Psych Involvement:  No (comment)  Admission diagnosis:  Transaminitis [R74.01] Lower abdominal pain [R10.30] Diverticulitis of both large and small intestine with abscess [K57.40] Patient Active Problem List   Diagnosis Date Noted   Diverticulitis 04/13/2023   HLD (hyperlipidemia) 04/13/2023   Diverticulitis of both large and small intestine with abscess 04/13/2023   PCP:  Soundra Pilon, FNP Pharmacy:   [id number] - [place], [place] - [address] [place] [address] Phone: [phone] Fax: [phone]     Social Determinants of Health (SDOH) Social History: SDOH Screenings   Tobacco Use: Unknown (04/13/2023)   SDOH Interventions:     Readmission Risk Interventions     No data to display

## 2023-04-17 NOTE — Consult Note (Signed)
Chief Complaint: Diverticular abscess  Referring Provider(s): Trixie Deis, PA-C  Supervising Physician: Irish Lack  Patient Status: Sarah Waters - In-pt  History of Present Illness: Sarah Waters is a 62 y.o. female who presented to the ED on 04/13/2023 with complaints of 5 days of left lower quadrant pain.  She also reports fevers and chills.  CT scan done 04/13/23 showed acute diverticulitis of the distal descending colon. There is a small amount of extraluminal gas and ill-defined fluid adjacent to this portion of the colon worrisome for perforation and developing abscess.  She was treated conservatively with bowel rest and antibiotics.  Repeat CT scan done today showed enlarging paracolic abscess adjacent to the inflamed proximal sigmoid colon.  We are asked to evaluate her for drain placement.  She had Lovenox this morning at 1030.  She is not currently NPO.  Allergies: Apple (diagnostic), Cherry, Peach flavor, Pear, Plum pulp, and Cantaloupe (diagnostic)  Medications: Prior to Admission medications   Medication Sig Start Date End Date Taking? Authorizing Provider  Brimonidine Tartrate (LUMIFY) 0.025 % SOLN Place 1 drop into the left eye daily.   Yes [provider]  cetirizine (ZYRTEC ALLERGY) 10 MG tablet Take 10 mg by mouth daily.   Yes [provider]  Cholecalciferol 125 MCG (5000 UT) capsule Take 5,000 Units by mouth daily. 08/01/22  Yes [provider]  estradiol (ESTRACE) 0.5 MG tablet Take 0.5 mg by mouth daily.   Yes [provider]  naproxen sodium (ALEVE) 220 MG tablet Take 220 mg by mouth daily as needed (AS needed).   Yes [provider]  Polyethyl Glycol-Propyl Glycol (SYSTANE HYDRATION PF OP) Place 1 drop into the left eye 2 (two) times daily.   Yes [provider]  simvastatin (ZOCOR) 10 MG tablet Take 10 mg by mouth daily at 6 PM.   Yes [provider]     No family history on  file.  Social History   Socioeconomic History   Marital status: Married    Spouse name: Not on file   Number of children: Not on file   Years of education: Not on file   Highest education level: Not on file  Occupational History   Not on file  Tobacco Use   Smoking status: Never   Smokeless tobacco: Not on file  Substance and Sexual Activity   Alcohol use: Not Currently   Drug use: Not Currently   Sexual activity: Not on file  Other Topics Concern   Not on file  Social History Narrative   Not on file   Social Determinants of Health   Financial Resource Strain: Not on file  Food Insecurity: Not on file  Transportation Needs: Not on file  Physical Activity: Not on file  Stress: Not on file  Social Connections: Not on file     Review of Systems: A 12 point ROS discussed and pertinent positives are indicated in the HPI above.  All other systems are negative.  Review of Systems  Vital Signs: BP 128/77 (BP Location: Right Arm)   Pulse 66   Temp 98 F (36.7 C)   Resp 15   Ht 5' (1.524 m)   Wt 118 lb 9.7 oz (53.8 kg)   SpO2 100%   BMI 23.16 kg/m   Advance Care Plan: The advanced care place/surrogate decision maker was discussed at the time of visit and the patient did not wish to discuss or was not able to name a surrogate decision  maker or provide an advance care plan.  Physical Exam Vitals reviewed.  Constitutional:      Appearance: Normal appearance.  HENT:     Head: Normocephalic and atraumatic.  Eyes:     Extraocular Movements: Extraocular movements intact.  Cardiovascular:     Rate and Rhythm: Normal rate and regular rhythm.  Pulmonary:     Effort: Pulmonary effort is normal. No respiratory distress.     Breath sounds: Normal breath sounds.  Abdominal:     Palpations: Abdomen is soft.  Musculoskeletal:        General: Normal range of motion.     Cervical back: Normal range of motion.  Skin:    General: Skin is warm and dry.  Neurological:      General: No focal deficit present.     Mental Status: She is alert and oriented to person, place, and time.  Psychiatric:        Mood and Affect: Mood normal.        Behavior: Behavior normal.        Thought Content: Thought content normal.        Judgment: Judgment normal.     Imaging: CT ABDOMEN PELVIS W CONTRAST  Result Date: 04/17/2023 CLINICAL DATA:  Diverticulitis EXAM: CT ABDOMEN AND PELVIS WITH CONTRAST TECHNIQUE: Multidetector CT imaging of the abdomen and pelvis was performed using the standard protocol following bolus administration of intravenous contrast. RADIATION DOSE REDUCTION: This exam was performed according to the departmental dose-optimization program which includes automated exposure control, adjustment of the mA and/or kV according to patient size and/or use of iterative reconstruction technique. CONTRAST:  75mL OMNIPAQUE IOHEXOL 350 MG/ML SOLN COMPARISON:  04/13/2023 FINDINGS: Lower chest: Left lower lobe scarring. Hepatobiliary: Focal steatosis in segment 4b adjacent to the falciform ligament. Otherwise unremarkable. Pancreas: Unremarkable Spleen: Unremarkable Adrenals/Urinary Tract: Unremarkable Stomach/Bowel: Substantial wall thickening in a 7 cm segment of the proximal sigmoid colon as on image 59 series 3 adjacent to a 3.7 by 2.7 by 4.3 cm (volume = 22 cm^3) paracolic abscess containing gas and fluid. By my measurements this previously measured 2.7 by 2.4 by 2.9 cm (volume = 9.8 cm^3). Descending and sigmoid colon diverticulosis. Orally administered contrast extends through to the rectum. No extraluminal leak of contrast is identified. Mild secondary wall thickening of a loop of small bowel that traverses adjacent to the abscess on image 54 series 6. Vascular/Lymphatic: Atherosclerosis is present, including aortoiliac atherosclerotic disease. Mild atheromatous plaque dorsally at the origin of the celiac trunk. Reproductive: Uterus absent.  Adnexa unremarkable. Other: Small  amount of gas in the right abdominal subcutaneous tissues, probably injection related. Musculoskeletal: Transitional S1 vertebra. Mild loss of disc height with vacuum disc phenomenon and mild endplate sclerosis at L5-S1. IMPRESSION: 1. Enlarging paracolic abscess adjacent to the inflamed proximal sigmoid colon, currently 22 cc and previously about 10 cc on 04/13/2023. 2. Mild loss of disc height and endplate sclerosis at L5-S1. Transitional S1 vertebra. 3. Mild scarring in the left lower lobe. 4. Mild atheromatous plaque dorsally at the origin of the celiac trunk, without occlusion. 5. Mild focal steatosis in segment 4b adjacent to the falciform ligament. 6. Aortic atherosclerosis. Aortic Atherosclerosis (ICD10-I70.0). Electronically Signed   By: Gaylyn Rong M.D.   On: 04/17/2023 10:56   CT ABDOMEN PELVIS W CONTRAST  Result Date: 04/13/2023 CLINICAL DATA:  Left lower quadrant pain.  New transaminitis. EXAM: CT ABDOMEN AND PELVIS WITH CONTRAST TECHNIQUE: Multidetector CT imaging of the abdomen and pelvis was  performed using the standard protocol following bolus administration of intravenous contrast. RADIATION DOSE REDUCTION: This exam was performed according to the departmental dose-optimization program which includes automated exposure control, adjustment of the mA and/or kV according to patient size and/or use of iterative reconstruction technique. CONTRAST:  55mL OMNIPAQUE IOHEXOL 350 MG/ML SOLN COMPARISON:  Report only CT abdomen and pelvis 02/11/2003 FINDINGS: Lower chest: No acute abnormality. Hepatobiliary: No focal liver abnormality is seen. No gallstones, gallbladder wall thickening, or biliary dilatation. Pancreas: Unremarkable. No pancreatic ductal dilatation or surrounding inflammatory changes. Spleen: Normal in size without focal abnormality. Adrenals/Urinary Tract: Adrenal glands are unremarkable. Kidneys are normal, without renal calculi, focal lesion, or hydronephrosis. Bladder is  unremarkable. Stomach/Bowel: There is diffuse colonic diverticulosis. There is wall thickening and inflammation of the distal descending colon most compatible with diverticulitis. There are small bubbles of extraluminal gas with surrounding ill-defined fluid adjacent to this portion of the colon measuring 3.5 x 2.4 x 2.7 cm image 3/57. There is no bowel obstruction. The appendix is not seen. Small bowel loops are within normal limits. Vascular/Lymphatic: No significant vascular findings are present. No enlarged abdominal or pelvic lymph nodes. Reproductive: Status post hysterectomy. No adnexal masses. Other: No abdominal wall hernia or abnormality. No abdominopelvic ascites. Musculoskeletal: No acute or significant osseous findings. IMPRESSION: Acute diverticulitis of the distal descending colon. There is a small amount of extraluminal gas and ill-defined fluid adjacent to this portion of the colon worrisome for perforation and developing abscess. Electronically Signed   By: Darliss Cheney M.D.   On: 04/13/2023 15:29    Labs:  CBC: Recent Labs    04/14/23 0028 04/15/23 0026 04/16/23 0129 04/17/23 0027  WBC 12.6* 14.8* 11.5* 8.6  HGB 11.0* 10.4* 10.2* 10.7*  HCT 34.0* 33.1* 30.7* 32.2*  PLT 339 349 344 378    COAGS: No results for input(s): "INR", "APTT" in the last 8760 hours.  BMP: Recent Labs    04/14/23 0028 04/15/23 0026 04/16/23 0129 04/17/23 0027  NA 138 135 136 136  K 3.1* 3.3* 3.5 3.2*  CL 106 107 100 102  CO2 22 16* 24 26  GLUCOSE 113* 87 159* 112*  BUN 6* 5* 5* <5*  CALCIUM 8.2* 7.7* 8.0* 8.4*  CREATININE 0.64 0.74 0.63 0.58  GFRNONAA >60 >60 >60 >60    LIVER FUNCTION TESTS: Recent Labs    04/14/23 0028 04/15/23 0026 04/16/23 0129 04/17/23 0027  BILITOT 0.7 0.9 0.7 0.6  AST 34 16 18 28   ALT 126* 82* 63* 59*  ALKPHOS 287* 212* 178* 183*  PROT 7.1 6.3* 6.0* 6.5  ALBUMIN 3.1* 2.6* 2.5* 2.7*    TUMOR MARKERS: No results for input(s): "AFPTM", "CEA", "CA199",  "CHROMGRNA" in the last 8760 hours.  Assessment and Plan:  Enlarging paracolic abscess adjacent to the inflamed proximal sigmoid colon.  Images reviewed by Dr. Fredia Sorrow.  Will proceed with image guided drain placement tomorrow. Will hold Lovenox and order INR per anticoagulation guidelines.  Risks and benefits discussed with the patient including bleeding, infection, damage to adjacent structures, bowel perforation/fistula connection, and sepsis.  All of the patient's questions were answered, patient is agreeable to proceed. Consent signed and in chart.  Thank you for allowing our service to participate in Danille Zielsdorf 's care.  Electronically Signed: Gwynneth Macleod, PA-C   04/17/2023, 12:16 PM      I spent a total of 40 Minutes  in face to face in clinical consultation, greater than 50% of which was counseling/coordinating  care for drain placement.

## 2023-04-17 NOTE — Progress Notes (Signed)
Progress Note     Subjective: Pt reports she had some diarrhea after CT contrast, had some nausea and vomiting with CT contrast as well. Overall pain is a little better but she has a lot of cramping whenever she has to urinate. No pneumaturia. She is passing flatus. She does not have much appetite  Objective: Vital signs in last 24 hours: Temp:  [98 F (36.7 C)-98.3 F (36.8 C)] 98 F (36.7 C) (06/04 0823) Pulse Rate:  [66-80] 66 (06/04 0823) Resp:  [15-16] 15 (06/04 0823) BP: (128-132)/(77-83) 128/77 (06/04 0823) SpO2:  [99 %-100 %] 100 % (06/04 0823) Last BM Date : 04/12/23  Intake/Output from previous day: 06/03 0701 - 06/04 0700 In: 337.9 [I.V.:209.2; IV Piggyback:128.8] Out: -  Intake/Output this shift: No intake/output data recorded.  PE: General: pleasant, WD, thin female who is laying in bed in NAD Heart: regular, rate, and rhythm.   Lungs: Respiratory effort nonlabored Abd: soft, mild ttp in LLQ without peritonitis, ND, +BS, no masses, hernias, or organomegaly Psych: A&Ox3 with an appropriate affect.    Lab Results:  Recent Labs    04/16/23 0129 04/17/23 0027  WBC 11.5* 8.6  HGB 10.2* 10.7*  HCT 30.7* 32.2*  PLT 344 378    BMET Recent Labs    04/16/23 0129 04/17/23 0027  NA 136 136  K 3.5 3.2*  CL 100 102  CO2 24 26  GLUCOSE 159* 112*  BUN 5* <5*  CREATININE 0.63 0.58  CALCIUM 8.0* 8.4*    PT/INR No results for input(s): "LABPROT", "INR" in the last 72 hours. CMP     Component Value Date/Time   NA 136 04/17/2023 0027   K 3.2 (L) 04/17/2023 0027   CL 102 04/17/2023 0027   CO2 26 04/17/2023 0027   GLUCOSE 112 (H) 04/17/2023 0027   BUN <5 (L) 04/17/2023 0027   CREATININE 0.58 04/17/2023 0027   CALCIUM 8.4 (L) 04/17/2023 0027   PROT 6.5 04/17/2023 0027   ALBUMIN 2.7 (L) 04/17/2023 0027   AST 28 04/17/2023 0027   ALT 59 (H) 04/17/2023 0027   ALKPHOS 183 (H) 04/17/2023 0027   BILITOT 0.6 04/17/2023 0027   GFRNONAA >60 04/17/2023  0027   Lipase     Component Value Date/Time   LIPASE 33 04/13/2023 1138       Studies/Results: No results found.  Anti-infectives: Anti-infectives (From admission, onward)    Start     Dose/Rate Route Frequency Ordered Stop   04/14/23 1000  cefTRIAXone (ROCEPHIN) 2 g in sodium chloride 0.9 % 100 mL IVPB  Status:  Discontinued        2 g 200 mL/hr over 30 Minutes Intravenous Every 24 hours 04/13/23 1753 04/14/23 0553   04/14/23 0645  piperacillin-tazobactam (ZOSYN) IVPB 3.375 g        3.375 g 12.5 mL/hr over 240 Minutes Intravenous Every 8 hours 04/14/23 0559     04/14/23 0500  metroNIDAZOLE (FLAGYL) IVPB 500 mg  Status:  Discontinued        500 mg 100 mL/hr over 60 Minutes Intravenous Every 12 hours 04/13/23 1753 04/14/23 0553   04/13/23 1645  metroNIDAZOLE (FLAGYL) IVPB 500 mg        500 mg 100 mL/hr over 60 Minutes Intravenous  Once 04/13/23 1630 04/13/23 1847   04/13/23 1645  cefTRIAXone (ROCEPHIN) 1 g in sodium chloride 0.9 % 100 mL IVPB        1 g 200 mL/hr over 30 Minutes Intravenous  Once 04/13/23 1630 04/13/23 1755        Assessment/Plan Complicated descending colon diverticulitis with phlegmon - WBC normalized and afebrile  - CT AP repeated this AM, final read pending but appears about the same to me, does not really appear to be a great window for drainage but will await final read from radiology - likely advance to FLD and continue IV abx - no indication for emergent surgical intervention at this time  FEN: ?FLD, IVF per [facility] VTE: LMWH ID: Zosyn   LOS: 4 days   I reviewed hospitalist notes, last 24 h vitals and pain scores, last 48 h intake and output, last 24 h labs and trends, and last 24 h imaging results.   Juliet Rude, [facility] 04/17/2023, 9:02 AM Please see Amion for pager number during day hours 7:00am-4:30pm

## 2023-04-17 NOTE — Plan of Care (Signed)

## 2023-04-18 ENCOUNTER — Inpatient Hospital Stay (HOSPITAL_COMMUNITY): Payer: 59

## 2023-04-18 DIAGNOSIS — R7401 Elevation of levels of liver transaminase levels: Secondary | ICD-10-CM

## 2023-04-18 DIAGNOSIS — E785 Hyperlipidemia, unspecified: Secondary | ICD-10-CM | POA: Diagnosis not present

## 2023-04-18 DIAGNOSIS — K5740 Diverticulitis of both small and large intestine with perforation and abscess without bleeding: Secondary | ICD-10-CM | POA: Diagnosis not present

## 2023-04-18 LAB — COMPREHENSIVE METABOLIC PANEL
ALT: 66 U/L — ABNORMAL HIGH (ref 0–44)
AST: 49 U/L — ABNORMAL HIGH (ref 15–41)
Albumin: 2.8 g/dL — ABNORMAL LOW (ref 3.5–5.0)
Alkaline Phosphatase: 187 U/L — ABNORMAL HIGH (ref 38–126)
Anion gap: 11 (ref 5–15)
BUN: 8 mg/dL (ref 8–23)
CO2: 23 mmol/L (ref 22–32)
Calcium: 8.7 mg/dL — ABNORMAL LOW (ref 8.9–10.3)
Chloride: 99 mmol/L (ref 98–111)
Creatinine, Ser: 0.71 mg/dL (ref 0.44–1.00)
GFR, Estimated: >60 mL/min (ref >60)
Glucose, Bld: 109 mg/dL — ABNORMAL HIGH (ref 70–99)
Potassium: 4 mmol/L (ref 3.5–5.1)
Sodium: 133 mmol/L — ABNORMAL LOW (ref 135–145)
Total Bilirubin: 0.8 mg/dL (ref 0.3–1.2)
Total Protein: 6.5 g/dL (ref 6.5–8.1)

## 2023-04-18 LAB — PROTIME-INR
INR: 1 (ref 0.8–1.2)
Prothrombin Time: 13.4 seconds (ref 11.4–15.2)

## 2023-04-18 LAB — CULTURE, BLOOD (ROUTINE X 2): Culture: NO GROWTH

## 2023-04-18 MED ORDER — MIDAZOLAM HCL 2 MG/2ML IJ SOLN
INTRAMUSCULAR | Status: AC | PRN
  Administered 2023-04-18: 1 mg via INTRAVENOUS

## 2023-04-18 MED ORDER — FENTANYL CITRATE (PF) 100 MCG/2ML IJ SOLN
INTRAMUSCULAR | Status: AC
  Filled 2023-04-18: qty 2

## 2023-04-18 MED ORDER — MIDAZOLAM HCL 2 MG/2ML IJ SOLN
INTRAMUSCULAR | Status: AC
  Filled 2023-04-18: qty 2

## 2023-04-18 NOTE — Sedation Documentation (Signed)
Procedure canceled by MD Deanne Coffer

## 2023-04-18 NOTE — Progress Notes (Signed)
PROGRESS NOTE  Sarah Waters ZOX:[id number] DOB: 04-Aug-1961   PCP: Soundra Pilon, FNP  Patient is from: Home.  DOA: 04/13/2023 LOS: 5  Chief complaints Chief Complaint  Patient presents with   Abdominal Pain     Brief Narrative / Interim history: 62 year old married female with PMH of HLD, chronic hormone replacement therapy, presented to the ED on 04/13/2023 with complaints of 5 days history of pain across lower abdomen, worse in LLQ with subjective fevers, chills, symptoms unresolved after OTC meds for presumed indigestion, and last BM 2 days PTA.  Never had a colonoscopy but had Cologuard which was negative.  Admitted for distal descending colon acute diverticulitis with perforation and developing abscess.  General surgery consulted, managing conservatively for now.  Clinically improved but CT abdomen on 6/4 raises concern for enlarging paracolic abscess adjacent to the inflamed proximal sigmoid colon, currently 22 cc and previously about 10 cc on 04/13/2023.  IR consulted but did not feel drain is necessary.    Subjective: Seen and examined earlier this morning.  No major events overnight of this morning.  No complaints.  Went down for CT-guided drain placement earlier this morning but told drain is not necessary.  Objective: Vitals:   04/17/23 1706 04/18/23 0021 04/18/23 0400 04/18/23 0957  BP: 118/69 117/74 101/67 137/78  Pulse: 75 75 62 72  Resp: 16 14 14 16   Temp: 98.1 F (36.7 C) 98.1 F (36.7 C) 98 F (36.7 C)   TempSrc:  Oral Oral   SpO2: 100% 100% 96% 91%  Weight:      Height:        Examination:  GENERAL: No apparent distress.  Nontoxic. HEENT: MMM.  Vision and hearing grossly intact.  NECK: Supple.  No apparent JVD.  RESP:  No IWOB.  Fair aeration bilaterally. CVS:  RRR. Heart sounds normal.  ABD/GI/GU: BS+. Abd soft, NTND.  MSK/EXT:  Moves extremities. No apparent deformity. No edema.  SKIN: no apparent skin lesion or wound NEURO: Awake, alert and  oriented appropriately.  No apparent focal neuro deficit. PSYCH: Calm. Normal affect.   Procedures:  None  Microbiology summarized: 5/31-blood cultures NGTD  Assessment and plan: Principal Problem:   Diverticulitis of both large and small intestine with abscess Active Problems:   Diverticulitis   HLD (hyperlipidemia)  Distal descending colon acute diverticulitis with perforation and developing abscess: Noted on CT on 5/31.  Surgery consulted and recommended conservative management with IV antibiotics and bowel rest.  Clinically improved but CT on 6/4 concerning for enlarging abscess.   -IR consulted.  Per patient, IR did not feel drain is necessary.  -Continue soft diet -Continue IV Zosyn -Needs colonoscopy outpatient once infection resolves.  Transaminitis: Unclear etiology.  Acute hepatitis and HIV screen negative.  Tylenol level negative.  Does not drink alcohol.  CT suggested focal stenosis.  Slightly worse today. -Continue monitoring   Hypokalemia: -Monitor replenish as appropriate   Microcytic anemia: Relatively stable Recent Labs    04/13/23 1138 04/14/23 0028 04/15/23 0026 04/16/23 0129 04/17/23 0027  HGB 11.8* 11.0* 10.4* 10.2* 10.7*  -Check anemia panel in the morning  Hyperlipidemia: On low-dose Zocor at home.   -Zocor held in the setting of transaminitis  Dehydration with hyponatremia: Resolved   Low bicarb: Resolved   Body mass index is 23.16 kg/m.          DVT prophylaxis:  enoxaparin (LOVENOX) injection 40 mg Start: 04/15/23 1200 Place and maintain sequential compression device Start: 04/13/23 1852  Code  Status: Full code Family Communication: Updated patient's husband at bedside Level of care: Med-Surg Status is: Inpatient Remains inpatient appropriate because: Acute diverticulitis with perforation and abscess   Final disposition: Home Consultants:  General surgery IR  35 minutes with more than 50% spent in reviewing records,  counseling patient/family and coordinating care.   Sch Meds:  Scheduled Meds:  acidophilus  2 capsule Oral TID   Brimonidine Tartrate  1 drop Left Eye Daily   cholecalciferol  5,000 Units Oral Daily   enoxaparin (LOVENOX) injection  40 mg Subcutaneous Q24H   estradiol  0.5 mg Oral Daily   Polyethyl Glyc-Propyl Glyc PF  1 drop Left Eye BID   Continuous Infusions:  piperacillin-tazobactam (ZOSYN)  IV 3.375 g (04/18/23 1437)   PRN Meds:.acetaminophen **OR** acetaminophen, HYDROmorphone (DILAUDID) injection, ondansetron **OR** ondansetron (ZOFRAN) IV, oxyCODONE  Antimicrobials: Anti-infectives (From admission, onward)    Start     Dose/Rate Route Frequency Ordered Stop   04/14/23 1000  cefTRIAXone (ROCEPHIN) 2 g in sodium chloride 0.9 % 100 mL IVPB  Status:  Discontinued        2 g 200 mL/hr over 30 Minutes Intravenous Every 24 hours 04/13/23 1753 04/14/23 0553   04/14/23 0645  piperacillin-tazobactam (ZOSYN) IVPB 3.375 g        3.375 g 12.5 mL/hr over 240 Minutes Intravenous Every 8 hours 04/14/23 0559     04/14/23 0500  metroNIDAZOLE (FLAGYL) IVPB 500 mg  Status:  Discontinued        500 mg 100 mL/hr over 60 Minutes Intravenous Every 12 hours 04/13/23 1753 04/14/23 0553   04/13/23 1645  metroNIDAZOLE (FLAGYL) IVPB 500 mg        500 mg 100 mL/hr over 60 Minutes Intravenous  Once 04/13/23 1630 04/13/23 1847   04/13/23 1645  cefTRIAXone (ROCEPHIN) 1 g in sodium chloride 0.9 % 100 mL IVPB        1 g 200 mL/hr over 30 Minutes Intravenous  Once 04/13/23 1630 04/13/23 1755        I have personally reviewed the following labs and images: CBC: Recent Labs  Lab 04/13/23 1138 04/14/23 0028 04/15/23 0026 04/16/23 0129 04/17/23 0027  WBC 10.2 12.6* 14.8* 11.5* 8.6  HGB 11.8* 11.0* 10.4* 10.2* 10.7*  HCT 35.6* 34.0* 33.1* 30.7* 32.2*  MCV 75.4* 75.2* 76.1* 74.7* 73.7*  PLT 334 339 349 344 378   BMP &GFR Recent Labs  Lab 04/14/23 0028 04/15/23 0026 04/16/23 0129  04/17/23 0027 04/18/23 0548  NA 138 135 136 136 133*  K 3.1* 3.3* 3.5 3.2* 4.0  CL 106 107 100 102 99  CO2 22 16* 24 26 23   GLUCOSE 113* 87 159* 112* 109*  BUN 6* 5* 5* <5* 8  CREATININE 0.64 0.74 0.63 0.58 0.71  CALCIUM 8.2* 7.7* 8.0* 8.4* 8.7*  MG 2.1  --   --  2.1  --    Estimated Creatinine Clearance: 53 mL/min (by C-G formula based on SCr of 0.71 mg/dL). Liver & Pancreas: Recent Labs  Lab 04/14/23 0028 04/15/23 0026 04/16/23 0129 04/17/23 0027 04/18/23 0548  AST 34 16 18 28  49*  ALT 126* 82* 63* 59* 66*  ALKPHOS 287* 212* 178* 183* 187*  BILITOT 0.7 0.9 0.7 0.6 0.8  PROT 7.1 6.3* 6.0* 6.5 6.5  ALBUMIN 3.1* 2.6* 2.5* 2.7* 2.8*   Recent Labs  Lab 04/13/23 1138  LIPASE 33   No results for input(s): "AMMONIA" in the last 168 hours. Diabetic: No results for input(s): "  HGBA1C" in the last 72 hours. No results for input(s): "GLUCAP" in the last 168 hours. Cardiac Enzymes: No results for input(s): "CKTOTAL", "CKMB", "CKMBINDEX", "TROPONINI" in the last 168 hours. No results for input(s): "PROBNP" in the last 8760 hours. Coagulation Profile: Recent Labs  Lab 04/18/23 0548  INR 1.0   Thyroid Function Tests: No results for input(s): "TSH", "T4TOTAL", "FREET4", "T3FREE", "THYROIDAB" in the last 72 hours. Lipid Profile: No results for input(s): "CHOL", "HDL", "LDLCALC", "TRIG", "CHOLHDL", "LDLDIRECT" in the last 72 hours. Anemia Panel: No results for input(s): "VITAMINB12", "FOLATE", "FERRITIN", "TIBC", "IRON", "RETICCTPCT" in the last 72 hours. Urine analysis:    Component Value Date/Time   COLORURINE STRAW (A) 04/13/2023 1155   APPEARANCEUR CLEAR 04/13/2023 1155   LABSPEC 1.003 (L) 04/13/2023 1155   PHURINE 8.0 04/13/2023 1155   GLUCOSEU NEGATIVE 04/13/2023 1155   HGBUR SMALL (A) 04/13/2023 1155   BILIRUBINUR NEGATIVE 04/13/2023 1155   KETONESUR NEGATIVE 04/13/2023 1155   PROTEINUR NEGATIVE 04/13/2023 1155   NITRITE NEGATIVE 04/13/2023 1155   LEUKOCYTESUR  NEGATIVE 04/13/2023 1155   Sepsis Labs: Invalid input(s): "PROCALCITONIN", "LACTICIDVEN"  Microbiology: Recent Results (from the past 240 hour(s))  Blood culture (routine x 2)     Status: None   Collection Time: 04/13/23  4:31 PM   Specimen: BLOOD  Result Value Ref Range Status   Specimen Description BLOOD RIGHT ANTECUBITAL  Final   Special Requests   Final    BOTTLES DRAWN AEROBIC AND ANAEROBIC Blood Culture adequate volume   Culture   Final    NO GROWTH 5 DAYS Performed at [facility], [address], [place], [place] [address]    Report Status 04/18/2023 FINAL  Final  Blood culture (routine x 2)     Status: None   Collection Time: 04/13/23  4:50 PM   Specimen: BLOOD RIGHT HAND  Result Value Ref Range Status   Specimen Description BLOOD RIGHT HAND  Final   Special Requests   Final    BOTTLES DRAWN AEROBIC AND ANAEROBIC Blood Culture adequate volume   Culture   Final    NO GROWTH 5 DAYS Performed at [facility], [address], [place], [place] [address]    Report Status 04/18/2023 FINAL  Final    Radiology Studies: No results found.    Vienna Folden T. Wolfgang Finigan [facility]  If 7PM-7AM, please contact night-coverage [email or web address] 04/18/2023, 2:56 PM home.

## 2023-04-18 NOTE — Progress Notes (Signed)
Progress Note     Subjective: Pt denies further n/v yesterday after CT. She is having bowel function. Overall pain improving but still has some in the LLQ. Getting ready to go to IR for drain.   Objective: Vital signs in last 24 hours: Temp:  [98 F (36.7 C)-98.1 F (36.7 C)] 98 F (36.7 C) (06/05 0400) Pulse Rate:  [62-75] 62 (06/05 0400) Resp:  [14-16] 14 (06/05 0400) BP: (101-118)/(67-74) 101/67 (06/05 0400) SpO2:  [96 %-100 %] 96 % (06/05 0400) Last BM Date : 04/17/23  Intake/Output from previous day: 06/04 0701 - 06/05 0700 In: 1231.3 [P.O.:1010; IV Piggyback:221.3] Out: -  Intake/Output this shift: No intake/output data recorded.  PE: General: pleasant, WD, thin female who is laying in bed in NAD Heart: regular, rate, and rhythm.   Lungs: Respiratory effort nonlabored Abd: soft, mild ttp in LLQ without peritonitis, ND, +BS, no masses, hernias, or organomegaly Psych: A&Ox3 with an appropriate affect.    Lab Results:  Recent Labs    04/16/23 0129 04/17/23 0027  WBC 11.5* 8.6  HGB 10.2* 10.7*  HCT 30.7* 32.2*  PLT 344 378    BMET Recent Labs    04/17/23 0027 04/18/23 0548  NA 136 133*  K 3.2* 4.0  CL 102 99  CO2 26 23  GLUCOSE 112* 109*  BUN <5* 8  CREATININE 0.58 0.71  CALCIUM 8.4* 8.7*    PT/INR Recent Labs    04/18/23 0548  LABPROT 13.4  INR 1.0   CMP     Component Value Date/Time   NA 133 (L) 04/18/2023 0548   K 4.0 04/18/2023 0548   CL 99 04/18/2023 0548   CO2 23 04/18/2023 0548   GLUCOSE 109 (H) 04/18/2023 0548   BUN 8 04/18/2023 0548   CREATININE 0.71 04/18/2023 0548   CALCIUM 8.7 (L) 04/18/2023 0548   PROT 6.5 04/18/2023 0548   ALBUMIN 2.8 (L) 04/18/2023 0548   AST 49 (H) 04/18/2023 0548   ALT 66 (H) 04/18/2023 0548   ALKPHOS 187 (H) 04/18/2023 0548   BILITOT 0.8 04/18/2023 0548   GFRNONAA >60 04/18/2023 0548   Lipase     Component Value Date/Time   LIPASE 33 04/13/2023 1138       Studies/Results: CT ABDOMEN  PELVIS W CONTRAST  Result Date: 04/17/2023 CLINICAL DATA:  Diverticulitis EXAM: CT ABDOMEN AND PELVIS WITH CONTRAST TECHNIQUE: Multidetector CT imaging of the abdomen and pelvis was performed using the standard protocol following bolus administration of intravenous contrast. RADIATION DOSE REDUCTION: This exam was performed according to the departmental dose-optimization program which includes automated exposure control, adjustment of the mA and/or kV according to patient size and/or use of iterative reconstruction technique. CONTRAST:  75mL OMNIPAQUE IOHEXOL 350 MG/ML SOLN COMPARISON:  04/13/2023 FINDINGS: Lower chest: Left lower lobe scarring. Hepatobiliary: Focal steatosis in segment 4b adjacent to the falciform ligament. Otherwise unremarkable. Pancreas: Unremarkable Spleen: Unremarkable Adrenals/Urinary Tract: Unremarkable Stomach/Bowel: Substantial wall thickening in a 7 cm segment of the proximal sigmoid colon as on image 59 series 3 adjacent to a 3.7 by 2.7 by 4.3 cm (volume = 22 cm^3) paracolic abscess containing gas and fluid. By my measurements this previously measured 2.7 by 2.4 by 2.9 cm (volume = 9.8 cm^3). Descending and sigmoid colon diverticulosis. Orally administered contrast extends through to the rectum. No extraluminal leak of contrast is identified. Mild secondary wall thickening of a loop of small bowel that traverses adjacent to the abscess on image 54 series 6. Vascular/Lymphatic: Atherosclerosis  is present, including aortoiliac atherosclerotic disease. Mild atheromatous plaque dorsally at the origin of the celiac trunk. Reproductive: Uterus absent.  Adnexa unremarkable. Other: Small amount of gas in the right abdominal subcutaneous tissues, probably injection related. Musculoskeletal: Transitional S1 vertebra. Mild loss of disc height with vacuum disc phenomenon and mild endplate sclerosis at L5-S1. IMPRESSION: 1. Enlarging paracolic abscess adjacent to the inflamed proximal sigmoid colon,  currently 22 cc and previously about 10 cc on 04/13/2023. 2. Mild loss of disc height and endplate sclerosis at L5-S1. Transitional S1 vertebra. 3. Mild scarring in the left lower lobe. 4. Mild atheromatous plaque dorsally at the origin of the celiac trunk, without occlusion. 5. Mild focal steatosis in segment 4b adjacent to the falciform ligament. 6. Aortic atherosclerosis. Aortic Atherosclerosis (ICD10-I70.0). Electronically Signed   By: Gaylyn Rong M.D.   On: 04/17/2023 10:56    Anti-infectives: Anti-infectives (From admission, onward)    Start     Dose/Rate Route Frequency Ordered Stop   04/14/23 1000  cefTRIAXone (ROCEPHIN) 2 g in sodium chloride 0.9 % 100 mL IVPB  Status:  Discontinued        2 g 200 mL/hr over 30 Minutes Intravenous Every 24 hours 04/13/23 1753 04/14/23 0553   04/14/23 0645  piperacillin-tazobactam (ZOSYN) IVPB 3.375 g        3.375 g 12.5 mL/hr over 240 Minutes Intravenous Every 8 hours 04/14/23 0559     04/14/23 0500  metroNIDAZOLE (FLAGYL) IVPB 500 mg  Status:  Discontinued        500 mg 100 mL/hr over 60 Minutes Intravenous Every 12 hours 04/13/23 1753 04/14/23 0553   04/13/23 1645  metroNIDAZOLE (FLAGYL) IVPB 500 mg        500 mg 100 mL/hr over 60 Minutes Intravenous  Once 04/13/23 1630 04/13/23 1847   04/13/23 1645  cefTRIAXone (ROCEPHIN) 1 g in sodium chloride 0.9 % 100 mL IVPB        1 g 200 mL/hr over 30 Minutes Intravenous  Once 04/13/23 1630 04/13/23 1755        Assessment/Plan Complicated descending colon diverticulitis with phlegmon - WBC normalized and afebrile  - CT AP yesterday with increased LLQ paracolic abscess - IR planning drain placement today  - ok to advance to soft diet after drain placement if doing well - no indication for emergent surgical intervention at this time  FEN: NPO, IVF per [facility] VTE: LMWH ID: [email or web address] 6/1>>  LOS: 5 days   I reviewed Consultant IR notes, hospitalist notes, last 24 h vitals and pain scores, last 48 h  intake and output, last 24 h labs and trends, and last 24 h imaging results.   Juliet Rude, [facility] 04/18/2023, 9:25 AM Please see Amion for pager number during day hours 7:00am-4:30pm

## 2023-04-18 NOTE — Plan of Care (Signed)

## 2023-04-18 NOTE — Progress Notes (Signed)
Pt has been NPO since midnight with just a few sips of water with meds.

## 2023-04-18 NOTE — Plan of Care (Signed)
  Problem: Health Behavior/Discharge Planning: Goal: Ability to manage health-related needs will improve Outcome: Progressing   Problem: Clinical Measurements: Goal: Ability to maintain clinical measurements within normal limits will improve Outcome: Progressing   Problem: Activity: Goal: Risk for activity intolerance will decrease Outcome: Progressing   Problem: Nutrition: Goal: Adequate nutrition will be maintained Outcome: Progressing   Problem: Pain Managment: Goal: General experience of comfort will improve Outcome: Progressing   Problem: Skin Integrity: Goal: Risk for impaired skin integrity will decrease Outcome: Progressing   

## 2023-04-19 DIAGNOSIS — K5740 Diverticulitis of both small and large intestine with perforation and abscess without bleeding: Secondary | ICD-10-CM | POA: Diagnosis not present

## 2023-04-19 DIAGNOSIS — E785 Hyperlipidemia, unspecified: Secondary | ICD-10-CM | POA: Diagnosis not present

## 2023-04-19 DIAGNOSIS — K5792 Diverticulitis of intestine, part unspecified, without perforation or abscess without bleeding: Secondary | ICD-10-CM | POA: Diagnosis not present

## 2023-04-19 LAB — COMPREHENSIVE METABOLIC PANEL
ALT: 80 U/L — ABNORMAL HIGH (ref 0–44)
AST: 64 U/L — ABNORMAL HIGH (ref 15–41)
Albumin: 2.8 g/dL — ABNORMAL LOW (ref 3.5–5.0)
Alkaline Phosphatase: 229 U/L — ABNORMAL HIGH (ref 38–126)
Anion gap: 12 (ref 5–15)
BUN: 14 mg/dL (ref 8–23)
CO2: 24 mmol/L (ref 22–32)
Calcium: 9.2 mg/dL (ref 8.9–10.3)
Chloride: 99 mmol/L (ref 98–111)
Creatinine, Ser: 0.75 mg/dL (ref 0.44–1.00)
GFR, Estimated: >60 mL/min (ref >60)
Glucose, Bld: 126 mg/dL — ABNORMAL HIGH (ref 70–99)
Potassium: 3.4 mmol/L — ABNORMAL LOW (ref 3.5–5.1)
Sodium: 135 mmol/L (ref 135–145)
Total Bilirubin: 0.5 mg/dL (ref 0.3–1.2)
Total Protein: 6.6 g/dL (ref 6.5–8.1)

## 2023-04-19 LAB — PHOSPHORUS: Phosphorus: 4.2 mg/dL (ref 2.5–4.6)

## 2023-04-19 LAB — CBC
HCT: 34.2 % — ABNORMAL LOW (ref 36.0–46.0)
Hemoglobin: 11.3 g/dL — ABNORMAL LOW (ref 12.0–15.0)
MCH: 24.4 pg — ABNORMAL LOW (ref 26.0–34.0)
MCHC: 33 g/dL (ref 30.0–36.0)
MCV: 73.9 fL — ABNORMAL LOW (ref 80.0–100.0)
Platelets: 429 10*3/uL — ABNORMAL HIGH (ref 150–400)
RBC: 4.63 MIL/uL (ref 3.87–5.11)
RDW: 14 % (ref 11.5–15.5)
WBC: 6.1 10*3/uL (ref 4.0–10.5)
nRBC: 0 % (ref 0.0–0.2)

## 2023-04-19 LAB — IRON AND TIBC
Iron: 48 ug/dL (ref 28–170)
Saturation Ratios: 18 % (ref 10.4–31.8)
TIBC: 270 ug/dL (ref 250–450)
UIBC: 222 ug/dL

## 2023-04-19 LAB — FERRITIN: Ferritin: 283 ng/mL (ref 11–307)

## 2023-04-19 LAB — RETICULOCYTES
Immature Retic Fract: 19.2 % — ABNORMAL HIGH (ref 2.3–15.9)
RBC.: 4.59 MIL/uL (ref 3.87–5.11)
Retic Count, Absolute: 59.7 10*3/uL (ref 19.0–186.0)
Retic Ct Pct: 1.3 % (ref 0.4–3.1)

## 2023-04-19 LAB — FOLATE: Folate: 21 ng/mL (ref >5.9)

## 2023-04-19 LAB — VITAMIN B12: Vitamin B-12: 1091 pg/mL — ABNORMAL HIGH (ref 180–914)

## 2023-04-19 LAB — MAGNESIUM: Magnesium: 2.1 mg/dL (ref 1.7–2.4)

## 2023-04-19 MED ORDER — POTASSIUM CHLORIDE CRYS ER 20 MEQ PO TBCR
60.0000 meq | EXTENDED_RELEASE_TABLET | Freq: Once | ORAL | Status: AC
  Administered 2023-04-19: 60 meq via ORAL
  Filled 2023-04-19: qty 3

## 2023-04-19 MED ORDER — AMOXICILLIN-POT CLAVULANATE 875-125 MG PO TABS: 1.0000 | ORAL_TABLET | Freq: Two times a day (BID) | ORAL | 0 refills | Status: AC

## 2023-04-19 MED ORDER — POTASSIUM CHLORIDE CRYS ER 20 MEQ PO TBCR: 40.0000 meq | EXTENDED_RELEASE_TABLET | ORAL | Status: DC

## 2023-04-19 NOTE — Progress Notes (Signed)
RN went over DC instructions with Pt and husband they both stated understanding. IV has been removed pt does not have a drain, new medication escribed to home pharmacy. Pt wants to eat then DC

## 2023-04-19 NOTE — Discharge Summary (Signed)
Physician Discharge Summary  Sarah Waters ZOX:[id number] DOB: August 29, 1961 DOA: 04/13/2023  PCP: Soundra Pilon, FNP  Admit date: 04/13/2023 Discharge date: 04/19/2023 Admitted From: Home Disposition: Home Recommendations for Outpatient Follow-up:  Follow up with PCP in 1 week Recommend ambulatory referral to GI for colonoscopy once diverticulitis is treated adequately Check CMP and CBC at follow-up Please follow up on the following pending results: None  Home Health: Not indicated Equipment/Devices: Not indicated  Discharge Condition: Stable CODE STATUS: Full code  Follow-up Information     Soundra Pilon, FNP. Schedule an appointment as soon as possible for a visit in 1 week(s).   Specialty: Family Medicine Contact information: [address] [place] [address] [phone]                 Hospital course 62 year old married female with PMH of HLD, chronic hormone replacement therapy, presented to the ED on 04/13/2023 with complaints of 5 days history of pain across lower abdomen, worse in LLQ with subjective fevers, chills, symptoms unresolved after OTC meds for presumed indigestion, and last BM 2 days PTA.  Never had a colonoscopy but had Cologuard which was negative.  Admitted for distal descending colon acute diverticulitis with perforation and developing abscess.  General surgery consulted, managing conservatively for now.  Clinically improved but CT abdomen on 6/4 raises concern for enlarging paracolic abscess adjacent to the inflamed proximal sigmoid colon, currently 22 cc and previously about 10 cc on 04/13/2023.  IR consulted for drain placement.  However, CT pelvis on 6/5 with Significant interval resolution of left pelvic fluid collection, and  percutaneous drain catheter placement was deferred.  She was cleared for discharge by general surgery for outpatient follow-up.  She is discharged on p.o. Augmentin for 8 more days to complete a total of 2 weeks.   Advised to continue low fiber residue diet until diverticulitis resolves, and slowly transition to high-fiber diet after that.  Recommend repeat CMP and CBC and ambulatory referral to GI at follow-up  See individual problem list below for more.   Problems addressed during this hospitalization Principal Problem:   Diverticulitis of both large and small intestine with abscess Active Problems:   Diverticulitis   HLD (hyperlipidemia)   Distal descending colon acute diverticulitis with perforation and developing abscess: Noted on CT on 5/31.  Surgery consulted and recommended conservative management with IV antibiotics and bowel rest.  Improved clinically and radiologically.  Cleared for discharge by general surgery. -Continue low fiber residue diet -Received IV Zosyn for 6 days in house and discharged on p.o. Augmentin for 8 more days. -Needs colonoscopy outpatient once infection resolves.  Recommend ambulatory referral to GI   Transaminitis: Unclear etiology.  Acute hepatitis and HIV screen negative.  Tylenol level negative.  Does not drink alcohol.  CT suggested focal stenosis.  Slightly worse today. -Recheck CMP at follow-up -Discontinue statin   Hypokalemia: Replenished prior to discharge.  Recommend repeat at follow-up -Monitor replenish as appropriate   Microcytic anemia: Stable. -Repeat CBC at follow-up   Hyperlipidemia: On low-dose Zocor at home.   -Discontinued Zocor due to transaminitis.   Dehydration with hyponatremia: Resolved   Low bicarb: Resolved           Time spent 35 minutes  Vital signs Vitals:   04/18/23 1641 04/18/23 1956 04/19/23 0512 04/19/23 0836  BP: 124/70 132/78 111/69 121/70  Pulse: 71 81 63 64  Temp: 97.9 F (36.6 C) 98.5 F (36.9 C) 98.6 F (37 C)  98.2 F (36.8 C)  Resp: 16   16  Height:      Weight:      SpO2: 100% 98% 98% 99%  TempSrc: Oral Oral  Oral  BMI (Calculated):         Discharge exam  GENERAL: No apparent distress.   Nontoxic. HEENT: MMM.  Vision and hearing grossly intact.  NECK: Supple.  No apparent JVD.  RESP:  No IWOB.  Fair aeration bilaterally. CVS:  RRR. Heart sounds normal.  ABD/GI/GU: BS+. Abd soft, NTND.  MSK/EXT:  Moves extremities. No apparent deformity. No edema.  SKIN: no apparent skin lesion or wound NEURO: Awake and alert. Oriented appropriately.  No apparent focal neuro deficit. PSYCH: Calm. Normal affect.   Discharge Instructions Discharge Instructions     Call MD for:  severe uncontrolled pain   Complete by: As directed    Diet general   Complete by: As directed    Low fiber residue diet for about a week. Then high-fiber diet.   Discharge instructions   Complete by: As directed    It has been a pleasure taking care of you!  You were hospitalized due to acute diverticulitis for which you have been treated with IV antibiotics.  Your symptoms improved.  We are discharging you on oral antibiotics to complete treatment course.  It is very important that you complete the whole course of treatment.  Also recommend low fiber residue diet until complete antibiotic course.  After that, would recommend high-fiber diet.  See a separate instruction about diet.  Follow-up with your primary care doctor in 1 to 2 weeks or sooner if needed.  It is very important that you have colonoscopy after completing treatment course for infection.  Ask your primary care doctor for referral to gastroenterology.   Take care,   Increase activity slowly   Complete by: As directed       Allergies as of 04/19/2023       Reactions   Apple (diagnostic) Itching   Cherry Itching   Peach Flavor Itching   Pear Itching   Plum Pulp Itching   Cantaloupe (diagnostic) Rash        Medication List     STOP taking these medications    Aleve 220 MG tablet Generic drug: naproxen sodium   simvastatin 10 MG tablet Commonly known as: ZOCOR       TAKE these medications    amoxicillin-clavulanate 875-125 MG  tablet Commonly known as: AUGMENTIN Take 1 tablet by mouth 2 (two) times daily for 8 days.   Cholecalciferol 125 MCG (5000 UT) capsule Take 5,000 Units by mouth daily.   estradiol 0.5 MG tablet Commonly known as: ESTRACE Take 0.5 mg by mouth daily.   Lumify 0.025 % Soln Generic drug: Brimonidine Tartrate Place 1 drop into the left eye daily.   SYSTANE HYDRATION PF OP Place 1 drop into the left eye 2 (two) times daily.   ZyrTEC Allergy 10 MG tablet Generic drug: cetirizine Take 10 mg by mouth daily.        Consultations: General surgery IR  Procedures/Studies: None  CT PELVIS LIMITED WO CONTRAST  Result Date: 04/19/2023 CLINICAL DATA:  [id number] Colonic diverticular abscess [id number] EXAM: CT PELVIS WITHOUT CONTRAST TECHNIQUE: Multidetector CT imaging of the pelvis was performed in anticipation of drain catheter placement. RADIATION DOSE REDUCTION: This exam was performed according to the departmental dose-optimization program which includes automated exposure control, adjustment of the mA and/or kV according to patient size and/or  use of iterative reconstruction technique. COMPARISON:  04/17/2023 and previous FINDINGS: Urinary Tract:  Urinary bladder physiologically distended. Bowel: Visualized small bowel and colon nondilated. Scattered diverticula from the visualized distal descending and sigmoid segments of the colon. Extraluminal phlegmonous process with scattered gas bubbles abutting the proximal sigmoid colon. The fluid component seen on previous day's exam has largely resolved. The overall cluster of residual external gas bubbles measures approximately 2.7 x 1.3 cm. No new or undrained fluid collection. Vascular/Lymphatic: No pathologically enlarged lymph nodes. No significant vascular abnormality seen. Reproductive:  Post hysterectomy.  No adnexal mass. Other:  No pelvic ascites or free air. Musculoskeletal: Degenerative disc disease L5-S1 as before. No acute findings.  IMPRESSION: 1. Significant interval resolution of left pelvic fluid collection. Cluster of extraluminal gas bubbles persists, with no significant drainable fluid component. Percutaneous drain catheter placement was therefore deferred. Electronically Signed   By: Corlis Leak M.D.   On: 04/19/2023 07:58   CT ABDOMEN PELVIS W CONTRAST  Result Date: 04/17/2023 CLINICAL DATA:  Diverticulitis EXAM: CT ABDOMEN AND PELVIS WITH CONTRAST TECHNIQUE: Multidetector CT imaging of the abdomen and pelvis was performed using the standard protocol following bolus administration of intravenous contrast. RADIATION DOSE REDUCTION: This exam was performed according to the departmental dose-optimization program which includes automated exposure control, adjustment of the mA and/or kV according to patient size and/or use of iterative reconstruction technique. CONTRAST:  75mL OMNIPAQUE IOHEXOL 350 MG/ML SOLN COMPARISON:  04/13/2023 FINDINGS: Lower chest: Left lower lobe scarring. Hepatobiliary: Focal steatosis in segment 4b adjacent to the falciform ligament. Otherwise unremarkable. Pancreas: Unremarkable Spleen: Unremarkable Adrenals/Urinary Tract: Unremarkable Stomach/Bowel: Substantial wall thickening in a 7 cm segment of the proximal sigmoid colon as on image 59 series 3 adjacent to a 3.7 by 2.7 by 4.3 cm (volume = 22 cm^3) paracolic abscess containing gas and fluid. By my measurements this previously measured 2.7 by 2.4 by 2.9 cm (volume = 9.8 cm^3). Descending and sigmoid colon diverticulosis. Orally administered contrast extends through to the rectum. No extraluminal leak of contrast is identified. Mild secondary wall thickening of a loop of small bowel that traverses adjacent to the abscess on image 54 series 6. Vascular/Lymphatic: Atherosclerosis is present, including aortoiliac atherosclerotic disease. Mild atheromatous plaque dorsally at the origin of the celiac trunk. Reproductive: Uterus absent.  Adnexa unremarkable. Other:  Small amount of gas in the right abdominal subcutaneous tissues, probably injection related. Musculoskeletal: Transitional S1 vertebra. Mild loss of disc height with vacuum disc phenomenon and mild endplate sclerosis at L5-S1. IMPRESSION: 1. Enlarging paracolic abscess adjacent to the inflamed proximal sigmoid colon, currently 22 cc and previously about 10 cc on 04/13/2023. 2. Mild loss of disc height and endplate sclerosis at L5-S1. Transitional S1 vertebra. 3. Mild scarring in the left lower lobe. 4. Mild atheromatous plaque dorsally at the origin of the celiac trunk, without occlusion. 5. Mild focal steatosis in segment 4b adjacent to the falciform ligament. 6. Aortic atherosclerosis. Aortic Atherosclerosis (ICD10-I70.0). Electronically Signed   By: Gaylyn Rong M.D.   On: 04/17/2023 10:56   CT ABDOMEN PELVIS W CONTRAST  Result Date: 04/13/2023 CLINICAL DATA:  Left lower quadrant pain.  New transaminitis. EXAM: CT ABDOMEN AND PELVIS WITH CONTRAST TECHNIQUE: Multidetector CT imaging of the abdomen and pelvis was performed using the standard protocol following bolus administration of intravenous contrast. RADIATION DOSE REDUCTION: This exam was performed according to the departmental dose-optimization program which includes automated exposure control, adjustment of the mA and/or kV according to patient size and/or  use of iterative reconstruction technique. CONTRAST:  55mL OMNIPAQUE IOHEXOL 350 MG/ML SOLN COMPARISON:  Report only CT abdomen and pelvis 02/11/2003 FINDINGS: Lower chest: No acute abnormality. Hepatobiliary: No focal liver abnormality is seen. No gallstones, gallbladder wall thickening, or biliary dilatation. Pancreas: Unremarkable. No pancreatic ductal dilatation or surrounding inflammatory changes. Spleen: Normal in size without focal abnormality. Adrenals/Urinary Tract: Adrenal glands are unremarkable. Kidneys are normal, without renal calculi, focal lesion, or hydronephrosis. Bladder is  unremarkable. Stomach/Bowel: There is diffuse colonic diverticulosis. There is wall thickening and inflammation of the distal descending colon most compatible with diverticulitis. There are small bubbles of extraluminal gas with surrounding ill-defined fluid adjacent to this portion of the colon measuring 3.5 x 2.4 x 2.7 cm image 3/57. There is no bowel obstruction. The appendix is not seen. Small bowel loops are within normal limits. Vascular/Lymphatic: No significant vascular findings are present. No enlarged abdominal or pelvic lymph nodes. Reproductive: Status post hysterectomy. No adnexal masses. Other: No abdominal wall hernia or abnormality. No abdominopelvic ascites. Musculoskeletal: No acute or significant osseous findings. IMPRESSION: Acute diverticulitis of the distal descending colon. There is a small amount of extraluminal gas and ill-defined fluid adjacent to this portion of the colon worrisome for perforation and developing abscess. Electronically Signed   By: Darliss Cheney M.D.   On: 04/13/2023 15:29       The results of significant diagnostics from this hospitalization (including imaging, microbiology, ancillary and laboratory) are listed below for reference.     Microbiology: Recent Results (from the past 240 hour(s))  Blood culture (routine x 2)     Status: None   Collection Time: 04/13/23  4:31 PM   Specimen: BLOOD  Result Value Ref Range Status   Specimen Description BLOOD RIGHT ANTECUBITAL  Final   Special Requests   Final    BOTTLES DRAWN AEROBIC AND ANAEROBIC Blood Culture adequate volume   Culture   Final    NO GROWTH 5 DAYS Performed at [facility], [address], [place], [place] [address]    Report Status 04/18/2023 FINAL  Final  Blood culture (routine x 2)     Status: None   Collection Time: 04/13/23  4:50 PM   Specimen: BLOOD RIGHT HAND  Result Value Ref Range Status   Specimen Description BLOOD RIGHT HAND  Final   Special Requests   Final    BOTTLES  DRAWN AEROBIC AND ANAEROBIC Blood Culture adequate volume   Culture   Final    NO GROWTH 5 DAYS Performed at [facility], [address], [place], [place] [address]    Report Status 04/18/2023 FINAL  Final     Labs:  CBC: Recent Labs  Lab 04/14/23 0028 04/15/23 0026 04/16/23 0129 04/17/23 0027 04/19/23 0101  WBC 12.6* 14.8* 11.5* 8.6 6.1  HGB 11.0* 10.4* 10.2* 10.7* 11.3*  HCT 34.0* 33.1* 30.7* 32.2* 34.2*  MCV 75.2* 76.1* 74.7* 73.7* 73.9*  PLT 339 349 344 378 429*   BMP &GFR Recent Labs  Lab 04/14/23 0028 04/15/23 0026 04/16/23 0129 04/17/23 0027 04/18/23 0548 04/19/23 0101  NA 138 135 136 136 133* 135  K 3.1* 3.3* 3.5 3.2* 4.0 3.4*  CL 106 107 100 102 99 99  CO2 22 16* 24 26 23 24   GLUCOSE 113* 87 159* 112* 109* 126*  BUN 6* 5* 5* <5* 8 14  CREATININE 0.64 0.74 0.63 0.58 0.71 0.75  CALCIUM 8.2* 7.7* 8.0* 8.4* 8.7* 9.2  MG 2.1  --   --  2.1  --  2.1  PHOS  --   --   --   --   --  4.2   Estimated Creatinine Clearance: 53 mL/min (by C-G formula based on SCr of 0.75 mg/dL). Liver & Pancreas: Recent Labs  Lab 04/15/23 0026 04/16/23 0129 04/17/23 0027 04/18/23 0548 04/19/23 0101  AST 16 18 28  49* 64*  ALT 82* 63* 59* 66* 80*  ALKPHOS 212* 178* 183* 187* 229*  BILITOT 0.9 0.7 0.6 0.8 0.5  PROT 6.3* 6.0* 6.5 6.5 6.6  ALBUMIN 2.6* 2.5* 2.7* 2.8* 2.8*   Recent Labs  Lab 04/13/23 1138  LIPASE 33   No results for input(s): "AMMONIA" in the last 168 hours. Diabetic: No results for input(s): "HGBA1C" in the last 72 hours. No results for input(s): "GLUCAP" in the last 168 hours. Cardiac Enzymes: No results for input(s): "CKTOTAL", "CKMB", "CKMBINDEX", "TROPONINI" in the last 168 hours. No results for input(s): "PROBNP" in the last 8760 hours. Coagulation Profile: Recent Labs  Lab 04/18/23 0548  INR 1.0   Thyroid Function Tests: No results for input(s): "TSH", "T4TOTAL", "FREET4", "T3FREE", "THYROIDAB" in the last 72 hours. Lipid Profile: No  results for input(s): "CHOL", "HDL", "LDLCALC", "TRIG", "CHOLHDL", "LDLDIRECT" in the last 72 hours. Anemia Panel: Recent Labs    04/19/23 0101  VITAMINB12 1,091*  FOLATE 21.0  FERRITIN 283  TIBC 270  IRON 48  RETICCTPCT 1.3   Urine analysis:    Component Value Date/Time   COLORURINE STRAW (A) 04/13/2023 1155   APPEARANCEUR CLEAR 04/13/2023 1155   LABSPEC 1.003 (L) 04/13/2023 1155   PHURINE 8.0 04/13/2023 1155   GLUCOSEU NEGATIVE 04/13/2023 1155   HGBUR SMALL (A) 04/13/2023 1155   BILIRUBINUR NEGATIVE 04/13/2023 1155   KETONESUR NEGATIVE 04/13/2023 1155   PROTEINUR NEGATIVE 04/13/2023 1155   NITRITE NEGATIVE 04/13/2023 1155   LEUKOCYTESUR NEGATIVE 04/13/2023 1155   Sepsis Labs: Invalid input(s): "PROCALCITONIN", "LACTICIDVEN"   SIGNED:  Almon Hercules, MD  [facility] 04/19/2023, 3:51 PM

## 2023-04-19 NOTE — Progress Notes (Signed)
Progress Note     Subjective: Pt tolerating soft diet, having loose stools but less cramping. Pain better overall. Denies nausea or vomiting. Did not get IR drain yesterday. Discussed recommendations for low fiber diet and colonoscopy in 6-8 weeks and answered patient's and her husband's questions.   Objective: Vital signs in last 24 hours: Temp:  [97.9 F (36.6 C)-98.6 F (37 C)] 98.6 F (37 C) (06/06 0512) Pulse Rate:  [63-81] 63 (06/06 0512) Resp:  [16] 16 (06/05 1641) BP: (111-137)/(69-78) 111/69 (06/06 0512) SpO2:  [91 %-100 %] 98 % (06/06 0512) Last BM Date : 04/18/23  Intake/Output from previous day: 06/05 0701 - 06/06 0700 In: 240 [P.O.:240] Out: -  Intake/Output this shift: No intake/output data recorded.  PE: General: pleasant, WD, thin female who is laying in bed in NAD Heart: regular, rate, and rhythm.   Lungs: Respiratory effort nonlabored Abd: soft, NT, ND, +BS, no masses, hernias, or organomegaly Psych: A&Ox3 with an appropriate affect.    Lab Results:  Recent Labs    04/17/23 0027 04/19/23 0101  WBC 8.6 6.1  HGB 10.7* 11.3*  HCT 32.2* 34.2*  PLT 378 429*    BMET Recent Labs    04/18/23 0548 04/19/23 0101  NA 133* 135  K 4.0 3.4*  CL 99 99  CO2 23 24  GLUCOSE 109* 126*  BUN 8 14  CREATININE 0.71 0.75  CALCIUM 8.7* 9.2    PT/INR Recent Labs    04/18/23 0548  LABPROT 13.4  INR 1.0    CMP     Component Value Date/Time   NA 135 04/19/2023 0101   K 3.4 (L) 04/19/2023 0101   CL 99 04/19/2023 0101   CO2 24 04/19/2023 0101   GLUCOSE 126 (H) 04/19/2023 0101   BUN 14 04/19/2023 0101   CREATININE 0.75 04/19/2023 0101   CALCIUM 9.2 04/19/2023 0101   PROT 6.6 04/19/2023 0101   ALBUMIN 2.8 (L) 04/19/2023 0101   AST 64 (H) 04/19/2023 0101   ALT 80 (H) 04/19/2023 0101   ALKPHOS 229 (H) 04/19/2023 0101   BILITOT 0.5 04/19/2023 0101   GFRNONAA >60 04/19/2023 0101   Lipase     Component Value Date/Time   LIPASE 33 04/13/2023  1138       Studies/Results: CT PELVIS LIMITED WO CONTRAST  Result Date: 04/19/2023 CLINICAL DATA:  [id number] Colonic diverticular abscess [id number] EXAM: CT PELVIS WITHOUT CONTRAST TECHNIQUE: Multidetector CT imaging of the pelvis was performed in anticipation of drain catheter placement. RADIATION DOSE REDUCTION: This exam was performed according to the departmental dose-optimization program which includes automated exposure control, adjustment of the mA and/or kV according to patient size and/or use of iterative reconstruction technique. COMPARISON:  04/17/2023 and previous FINDINGS: Urinary Tract:  Urinary bladder physiologically distended. Bowel: Visualized small bowel and colon nondilated. Scattered diverticula from the visualized distal descending and sigmoid segments of the colon. Extraluminal phlegmonous process with scattered gas bubbles abutting the proximal sigmoid colon. The fluid component seen on previous day's exam has largely resolved. The overall cluster of residual external gas bubbles measures approximately 2.7 x 1.3 cm. No new or undrained fluid collection. Vascular/Lymphatic: No pathologically enlarged lymph nodes. No significant vascular abnormality seen. Reproductive:  Post hysterectomy.  No adnexal mass. Other:  No pelvic ascites or free air. Musculoskeletal: Degenerative disc disease L5-S1 as before. No acute findings. IMPRESSION: 1. Significant interval resolution of left pelvic fluid collection. Cluster of extraluminal gas bubbles persists, with no significant drainable fluid component. Percutaneous  drain catheter placement was therefore deferred. Electronically Signed   By: Corlis Leak M.D.   On: 04/19/2023 07:58    Anti-infectives: Anti-infectives (From admission, onward)    Start     Dose/Rate Route Frequency Ordered Stop   04/19/23 0000  amoxicillin-clavulanate (AUGMENTIN) 875-125 MG tablet        1 tablet Oral 2 times daily 04/19/23 0825 04/27/23 2359   04/14/23 1000   cefTRIAXone (ROCEPHIN) 2 g in sodium chloride 0.9 % 100 mL IVPB  Status:  Discontinued        2 g 200 mL/hr over 30 Minutes Intravenous Every 24 hours 04/13/23 1753 04/14/23 0553   04/14/23 0645  piperacillin-tazobactam (ZOSYN) IVPB 3.375 g        3.375 g 12.5 mL/hr over 240 Minutes Intravenous Every 8 hours 04/14/23 0559     04/14/23 0500  metroNIDAZOLE (FLAGYL) IVPB 500 mg  Status:  Discontinued        500 mg 100 mL/hr over 60 Minutes Intravenous Every 12 hours 04/13/23 1753 04/14/23 0553   04/13/23 1645  metroNIDAZOLE (FLAGYL) IVPB 500 mg        500 mg 100 mL/hr over 60 Minutes Intravenous  Once 04/13/23 1630 04/13/23 1847   04/13/23 1645  cefTRIAXone (ROCEPHIN) 1 g in sodium chloride 0.9 % 100 mL IVPB        1 g 200 mL/hr over 30 Minutes Intravenous  Once 04/13/23 1630 04/13/23 1755        Assessment/Plan Complicated descending colon diverticulitis with phlegmon - WBC normalized and afebrile  - CT AP 6/4 with increased LLQ paracolic abscess - IR took down for drain placement yesterday but then it appears resolved on CT yesterday - tolerating soft diet - dietician consult for education on low fiber diet with transition to high fiber in 6-8 weeks  - stable for DC from surgery perspective, would recommend discharge on PO augmentin to complete 14 day total course. Follow up with PCP and will need referral to GI for colonoscopy in 6-8 weeks.   FEN: soft diet  VTE: LMWH ID: Zosyn 6/1>>  LOS: 6 days   I reviewed Consultant IR notes, hospitalist notes, last 24 h vitals and pain scores, last 48 h intake and output, last 24 h labs and trends, and last 24 h imaging results.   Juliet Rude, [facility] 04/19/2023, 8:32 AM Please see Amion for pager number during day hours 7:00am-4:30pm

## 2023-04-19 NOTE — Progress Notes (Signed)
Nutrition Education Note   RD consulted for nutrition education regarding Nutrition Management for Diverticulosis/Diverticulitis.  RD provided both "Low Fiber Nutrition Therapy" and "High Fiber Nutrition Therapy" handout from the Academy of [employer]etics.  Explained reasons to follow Low Fiber diet for the next 6-8 weeks and discussed ways to achieve. Reviewed foods allowed and to avoid on low-fiber diet. Discussed best practice for long-term management of diverticulosis is a High Fiber diet and discussed ways to increase fiber content in an appropriate time frame. Encouraged fresh fruits and vegetables as well as whole grain sources of carbohydrates to maximize fiber intake.  Reviewed foods pt eats now to adjust during period of low-fiber diet.   Pt verbalizes understanding of information provided. Expect good compliance.  Pt up in chair, husband at bedside. Reports that pt appetite was good at home and getting better now. Denies any recent weight loss.   Current diet order is Soft, patient is consuming approximately 100% of meals at this time.  Labs and medications reviewed.  No further nutrition interventions warranted at this time. If additional nutrition issues arise, please re-consult Rd.   Kirby Crigler RD, LDN Clinical Dietitian See Loretha Stapler for contact information.

## 2024-11-25 ENCOUNTER — Other Ambulatory Visit: Payer: Self-pay

## 2024-11-25 ENCOUNTER — Encounter (HOSPITAL_BASED_OUTPATIENT_CLINIC_OR_DEPARTMENT_OTHER): Payer: Self-pay

## 2024-11-25 ENCOUNTER — Emergency Department (HOSPITAL_BASED_OUTPATIENT_CLINIC_OR_DEPARTMENT_OTHER)
Admission: EM | Admit: 2024-11-25 | Discharge: 2024-11-26 | Disposition: A | Discharge status: Home / Self Care | Attending: Emergency Medicine | Admitting: Emergency Medicine

## 2024-11-25 DIAGNOSIS — R202 Paresthesia of skin: Secondary | ICD-10-CM | POA: Insufficient documentation

## 2024-11-25 DIAGNOSIS — T380X5A Adverse effect of glucocorticoids and synthetic analogues, initial encounter: Secondary | ICD-10-CM | POA: Insufficient documentation

## 2024-11-25 LAB — COMPREHENSIVE METABOLIC PANEL WITH GFR
ALT: 24 U/L (ref 0–44)
AST: 21 U/L (ref 15–41)
Albumin: 4.5 g/dL (ref 3.5–5.0)
Alkaline Phosphatase: 79 U/L (ref 38–126)
Anion gap: 12 (ref 5–15)
BUN: 11 mg/dL (ref 8–23)
CO2: 24 mmol/L (ref 22–32)
Calcium: 9.2 mg/dL (ref 8.9–10.3)
Chloride: 98 mmol/L (ref 98–111)
Creatinine, Ser: 0.56 mg/dL (ref 0.44–1.00)
GFR, Estimated: >60 mL/min
Glucose, Bld: 117 mg/dL — ABNORMAL HIGH (ref 70–99)
Potassium: 3.9 mmol/L (ref 3.5–5.1)
Sodium: 135 mmol/L (ref 135–145)
Total Bilirubin: 0.4 mg/dL (ref 0.0–1.2)
Total Protein: 7.1 g/dL (ref 6.5–8.1)

## 2024-11-25 LAB — CBC
HCT: 37.1 % (ref 36.0–46.0)
Hemoglobin: 12.1 g/dL (ref 12.0–15.0)
MCH: 24.7 pg — ABNORMAL LOW (ref 26.0–34.0)
MCHC: 32.6 g/dL (ref 30.0–36.0)
MCV: 75.7 fL — ABNORMAL LOW (ref 80.0–100.0)
Platelets: 376 K/uL (ref 150–400)
RBC: 4.9 MIL/uL (ref 3.87–5.11)
RDW: 14.4 % (ref 11.5–15.5)
WBC: 7.2 K/uL (ref 4.0–10.5)
nRBC: 0 % (ref 0.0–0.2)

## 2024-11-25 LAB — CBG MONITORING, ED: Glucose-Capillary: 123 mg/dL — ABNORMAL HIGH (ref 70–99)

## 2024-11-25 NOTE — ED Provider Notes (Signed)
 "  [facility]  Provider Note  CSN: [id number] Arrival date & time: 11/25/24 2215  History Chief Complaint  Patient presents with   Fatigue    Sarah Waters is a 64 y.o. female here with husband for evaluation of fatigue. She reports she has been sick since December with flu symptoms. She saw PCP multiple times since then and has been given two different antibiotics and two consecutive courses of prednisone. She reports her flu symptoms have improved but since she finished the steroids she has been feeling generally weak, some tingling in her perioral and L leg areas. No focal deficits. No fevers. No vomiting. She also reports BP was elevated at home. She had labs done at PCP office last week and something was high, but they aren't sure what it was.    Home Medications Prior to Admission medications  Medication Sig Start Date End Date Taking? Authorizing Provider  Brimonidine  Tartrate (LUMIFY ) 0.025 % SOLN Place 1 drop into the left eye daily.    [provider]  cetirizine (ZYRTEC ALLERGY) 10 MG tablet Take 10 mg by mouth daily.    [provider]  Cholecalciferol  125 MCG (5000 UT) capsule Take 5,000 Units by mouth daily. 08/01/22   [provider]  estradiol  (ESTRACE ) 0.5 MG tablet Take 0.5 mg by mouth daily.    [provider]  Polyethyl Glycol-Propyl Glycol (SYSTANE HYDRATION PF OP) Place 1 drop into the left eye 2 (two) times daily.    [provider]     Allergies    Apple (diagnostic), Connell, Peach flavoring agent (non-screening), Pear, Plum pulp, and Cantaloupe (diagnostic)   Review of Systems   Review of Systems Please see HPI for pertinent positives and negatives  Physical Exam BP (!) 172/100   Pulse 81   Temp 97.9 F (36.6 C)   Resp 16   Ht 5' (1.524 m)   Wt 50.3 kg   SpO2 97%   BMI 21.68 kg/m   Physical Exam Vitals and nursing note reviewed.  Constitutional:       Appearance: Normal appearance.  HENT:     Head: Normocephalic and atraumatic.     Nose: Nose normal.     Mouth/Throat:     Mouth: Mucous membranes are moist.  Eyes:     Extraocular Movements: Extraocular movements intact.     Conjunctiva/sclera: Conjunctivae normal.  Cardiovascular:     Rate and Rhythm: Normal rate.  Pulmonary:     Effort: Pulmonary effort is normal.     Breath sounds: Normal breath sounds.  Abdominal:     General: Abdomen is flat.     Palpations: Abdomen is soft.     Tenderness: There is no abdominal tenderness.  Musculoskeletal:        General: No swelling. Normal range of motion.     Cervical back: Neck supple.  Skin:    General: Skin is warm and dry.  Neurological:     General: No focal deficit present.     Mental Status: She is alert and oriented to person, place, and time.     Cranial Nerves: No cranial nerve deficit.     Sensory: No sensory deficit.     Motor: No weakness.     Gait: Gait normal.  Psychiatric:        Mood and Affect: Mood normal.     ED Results / Procedures / Treatments   EKG EKG Interpretation Date/Time:  Tuesday November 25 2024 22:35:15 EST Ventricular Rate:  72 PR Interval:  157 QRS Duration:  75 QT Interval:  388 QTC Calculation: 425 R Axis:   53  Text Interpretation: Sinus rhythm Probable left atrial enlargement Confirmed by Darra Chew [phone]) on 11/25/2024 10:36:36 PM  Procedures Procedures  Medications Ordered in the ED Medications - No data to display  Initial Impression and Plan  Patient here with generalized weakness among other symptoms all started after taking high dose prednisone for 10 days. She had labs here which show unremarkable CBC and CMP. I suspect her symptoms are due to the steroids and will resolved slowly over the next few days. No signs of acute adrenal crisis. Recommend continued supportive care at home, PCP follow up, RTED for any other concerns.    ED Course       MDM  Rules/Calculators/A&P Medical Decision Making Problems Addressed: Adverse effect of prednisone, initial encounter: acute illness or injury  Amount and/or Complexity of Data Reviewed Labs: ordered. Decision-making details documented in ED Course. ECG/medicine tests: ordered and independent interpretation performed. Decision-making details documented in ED Course.  Risk Prescription drug management.     Final Clinical Impression(s) / ED Diagnoses Final diagnoses:  Adverse effect of prednisone, initial encounter    Rx / DC Orders ED Discharge Orders     None        Roselyn Carlin NOVAK, MD 11/26/24 0004  "

## 2024-11-25 NOTE — ED Triage Notes (Signed)
 Pt reports she has been sick since Dec 22. She was diagnosed with flu on Dec 29. She has not felt better and went to [facility] walk in clinic on Jan 1, was diagnosed with Bronchitis and was prescribed with an abx and steroid. She followed up with them on Jan 4 and was cleared. Then on 1/9 she began to feel very tired, had blood work and chest xray done and was cleared again. She is here tonight because she still feels tired and has numbness to lips, face, and both of her legs, onset a week ago.

## 2024-11-26 NOTE — ED Notes (Signed)
